# Patient Record
Sex: Male | Born: 1935 | Race: White | Hispanic: No | Smoking: Former smoker
Health system: Southern US, Community
[De-identification: ages and names within clinical notes are randomized; demographics above are authoritative.]

## PROBLEM LIST (undated history)

## (undated) DIAGNOSIS — C801 Malignant (primary) neoplasm, unspecified: Secondary | ICD-10-CM

## (undated) DIAGNOSIS — R51 Headache: Secondary | ICD-10-CM

## (undated) DIAGNOSIS — E119 Type 2 diabetes mellitus without complications: Secondary | ICD-10-CM

## (undated) HISTORY — PX: HERNIA REPAIR: SHX51

## (undated) HISTORY — DX: Malignant (primary) neoplasm, unspecified: C80.1

## (undated) HISTORY — DX: Type 2 diabetes mellitus without complications: E11.9

## (undated) HISTORY — PX: EYE SURGERY: SHX253

## (undated) HISTORY — DX: Headache: R51

---

## 2004-11-12 ENCOUNTER — Other Ambulatory Visit: Payer: Self-pay

## 2004-11-12 ENCOUNTER — Emergency Department: Payer: Self-pay | Admitting: Emergency Medicine

## 2004-11-25 ENCOUNTER — Ambulatory Visit: Payer: Self-pay | Admitting: Internal Medicine

## 2004-12-09 ENCOUNTER — Ambulatory Visit: Payer: Self-pay | Admitting: Oncology

## 2004-12-26 ENCOUNTER — Ambulatory Visit: Payer: Self-pay | Admitting: Oncology

## 2005-04-07 ENCOUNTER — Ambulatory Visit: Payer: Self-pay | Admitting: Oncology

## 2005-04-11 ENCOUNTER — Ambulatory Visit: Payer: Self-pay | Admitting: Family Medicine

## 2005-04-25 ENCOUNTER — Ambulatory Visit: Payer: Self-pay | Admitting: Oncology

## 2005-08-08 ENCOUNTER — Ambulatory Visit: Payer: Self-pay | Admitting: Oncology

## 2005-08-26 ENCOUNTER — Ambulatory Visit: Payer: Self-pay | Admitting: Oncology

## 2006-01-18 ENCOUNTER — Ambulatory Visit: Payer: Self-pay | Admitting: Gastroenterology

## 2006-01-30 ENCOUNTER — Ambulatory Visit: Payer: Self-pay | Admitting: Gastroenterology

## 2006-02-06 ENCOUNTER — Ambulatory Visit: Payer: Self-pay | Admitting: Oncology

## 2006-02-23 ENCOUNTER — Ambulatory Visit: Payer: Self-pay | Admitting: Oncology

## 2006-08-07 ENCOUNTER — Ambulatory Visit: Payer: Self-pay | Admitting: Oncology

## 2006-08-26 ENCOUNTER — Ambulatory Visit: Payer: Self-pay | Admitting: Oncology

## 2006-09-25 ENCOUNTER — Ambulatory Visit: Payer: Self-pay | Admitting: Oncology

## 2006-10-27 ENCOUNTER — Emergency Department: Payer: Self-pay | Admitting: Internal Medicine

## 2006-10-27 ENCOUNTER — Other Ambulatory Visit: Payer: Self-pay

## 2006-11-08 ENCOUNTER — Inpatient Hospital Stay: Payer: Self-pay | Admitting: Internal Medicine

## 2006-11-08 ENCOUNTER — Other Ambulatory Visit: Payer: Self-pay

## 2006-11-30 ENCOUNTER — Ambulatory Visit: Payer: Self-pay | Admitting: Family Medicine

## 2007-01-22 ENCOUNTER — Ambulatory Visit: Payer: Self-pay | Admitting: Gastroenterology

## 2007-02-01 ENCOUNTER — Ambulatory Visit: Payer: Self-pay | Admitting: Internal Medicine

## 2007-02-24 ENCOUNTER — Ambulatory Visit: Payer: Self-pay | Admitting: Internal Medicine

## 2007-03-27 ENCOUNTER — Ambulatory Visit: Payer: Self-pay | Admitting: Family Medicine

## 2007-07-27 ENCOUNTER — Ambulatory Visit: Payer: Self-pay | Admitting: Internal Medicine

## 2007-07-31 ENCOUNTER — Ambulatory Visit: Payer: Self-pay | Admitting: Oncology

## 2007-08-27 ENCOUNTER — Ambulatory Visit: Payer: Self-pay | Admitting: Oncology

## 2007-08-27 ENCOUNTER — Ambulatory Visit: Payer: Self-pay | Admitting: Internal Medicine

## 2007-09-26 ENCOUNTER — Ambulatory Visit: Payer: Self-pay | Admitting: Oncology

## 2007-11-05 ENCOUNTER — Ambulatory Visit: Payer: Self-pay | Admitting: Ophthalmology

## 2008-01-24 ENCOUNTER — Ambulatory Visit: Payer: Self-pay | Admitting: Gastroenterology

## 2008-01-27 ENCOUNTER — Ambulatory Visit: Payer: Self-pay | Admitting: Internal Medicine

## 2008-01-29 ENCOUNTER — Ambulatory Visit: Payer: Self-pay | Admitting: Internal Medicine

## 2008-02-24 ENCOUNTER — Ambulatory Visit: Payer: Self-pay | Admitting: Internal Medicine

## 2008-06-16 ENCOUNTER — Other Ambulatory Visit: Payer: Self-pay

## 2008-06-16 ENCOUNTER — Emergency Department: Payer: Self-pay | Admitting: Emergency Medicine

## 2008-06-24 ENCOUNTER — Ambulatory Visit: Payer: Self-pay | Admitting: Family Medicine

## 2008-07-26 ENCOUNTER — Ambulatory Visit: Payer: Self-pay | Admitting: Internal Medicine

## 2008-07-29 ENCOUNTER — Ambulatory Visit: Payer: Self-pay | Admitting: Oncology

## 2008-07-31 ENCOUNTER — Ambulatory Visit: Payer: Self-pay | Admitting: Gastroenterology

## 2008-08-26 ENCOUNTER — Ambulatory Visit: Payer: Self-pay | Admitting: Oncology

## 2008-08-26 ENCOUNTER — Ambulatory Visit: Payer: Self-pay | Admitting: Internal Medicine

## 2008-12-31 ENCOUNTER — Ambulatory Visit: Payer: Self-pay | Admitting: Physician Assistant

## 2009-01-26 ENCOUNTER — Ambulatory Visit: Payer: Self-pay | Admitting: Internal Medicine

## 2009-01-27 ENCOUNTER — Ambulatory Visit: Payer: Self-pay | Admitting: Oncology

## 2009-02-03 ENCOUNTER — Ambulatory Visit: Payer: Self-pay | Admitting: Gastroenterology

## 2009-02-23 ENCOUNTER — Ambulatory Visit: Payer: Self-pay | Admitting: Oncology

## 2009-02-23 ENCOUNTER — Ambulatory Visit: Payer: Self-pay | Admitting: Internal Medicine

## 2009-07-26 ENCOUNTER — Ambulatory Visit: Payer: Self-pay | Admitting: Oncology

## 2009-08-04 ENCOUNTER — Ambulatory Visit: Payer: Self-pay | Admitting: Oncology

## 2009-08-26 ENCOUNTER — Ambulatory Visit: Payer: Self-pay | Admitting: Oncology

## 2010-01-26 ENCOUNTER — Ambulatory Visit: Payer: Self-pay | Admitting: Internal Medicine

## 2010-02-09 ENCOUNTER — Ambulatory Visit: Payer: Self-pay | Admitting: Internal Medicine

## 2010-02-23 ENCOUNTER — Ambulatory Visit: Payer: Self-pay | Admitting: Internal Medicine

## 2010-04-05 ENCOUNTER — Ambulatory Visit: Payer: Self-pay | Admitting: Gastroenterology

## 2010-08-10 ENCOUNTER — Ambulatory Visit: Payer: Self-pay | Admitting: Internal Medicine

## 2010-08-10 ENCOUNTER — Ambulatory Visit: Payer: Self-pay | Admitting: Oncology

## 2010-08-12 LAB — CEA: CEA: 1.4 ng/mL (ref 0.0–4.7)

## 2010-08-26 ENCOUNTER — Ambulatory Visit: Payer: Self-pay | Admitting: Oncology

## 2010-08-26 ENCOUNTER — Ambulatory Visit: Payer: Self-pay | Admitting: Internal Medicine

## 2011-09-06 ENCOUNTER — Ambulatory Visit: Payer: Self-pay | Admitting: Internal Medicine

## 2011-09-26 ENCOUNTER — Ambulatory Visit: Payer: Self-pay | Admitting: Internal Medicine

## 2012-09-10 ENCOUNTER — Ambulatory Visit: Payer: Self-pay | Admitting: Oncology

## 2012-09-10 LAB — CBC CANCER CENTER
Basophil #: 0.1 x10 3/mm (ref 0.0–0.1)
Eosinophil %: 3.3 %
HCT: 42.8 % (ref 40.0–52.0)
HGB: 14.4 g/dL (ref 13.0–18.0)
Lymphocyte #: 1.7 x10 3/mm (ref 1.0–3.6)
Lymphocyte %: 30 %
MCHC: 33.7 g/dL (ref 32.0–36.0)
MCV: 93 fL (ref 80–100)
Monocyte %: 8.4 %
Neutrophil %: 57.3 %
Platelet: 119 x10 3/mm — ABNORMAL LOW (ref 150–440)
RBC: 4.6 10*6/uL (ref 4.40–5.90)
RDW: 14.3 % (ref 11.5–14.5)

## 2012-09-10 LAB — COMPREHENSIVE METABOLIC PANEL
Albumin: 4 g/dL (ref 3.4–5.0)
Alkaline Phosphatase: 70 U/L (ref 50–136)
Anion Gap: 3 — ABNORMAL LOW (ref 7–16)
Bilirubin,Total: 1.1 mg/dL — ABNORMAL HIGH (ref 0.2–1.0)
Calcium, Total: 9.5 mg/dL (ref 8.5–10.1)
Co2: 33 mmol/L — ABNORMAL HIGH (ref 21–32)
Glucose: 118 mg/dL — ABNORMAL HIGH (ref 65–99)
SGOT(AST): 33 U/L (ref 15–37)
Sodium: 138 mmol/L (ref 136–145)
Total Protein: 7.3 g/dL (ref 6.4–8.2)

## 2012-09-11 LAB — CEA: CEA: 1.6 ng/mL (ref 0.0–4.7)

## 2012-09-25 ENCOUNTER — Ambulatory Visit: Payer: Self-pay | Admitting: Oncology

## 2012-12-14 DIAGNOSIS — I10 Essential (primary) hypertension: Secondary | ICD-10-CM | POA: Insufficient documentation

## 2012-12-14 DIAGNOSIS — IMO0002 Reserved for concepts with insufficient information to code with codable children: Secondary | ICD-10-CM | POA: Insufficient documentation

## 2012-12-14 DIAGNOSIS — M3390 Dermatopolymyositis, unspecified, organ involvement unspecified: Secondary | ICD-10-CM | POA: Insufficient documentation

## 2012-12-14 DIAGNOSIS — R4189 Other symptoms and signs involving cognitive functions and awareness: Secondary | ICD-10-CM | POA: Insufficient documentation

## 2013-08-22 ENCOUNTER — Ambulatory Visit: Payer: Self-pay | Admitting: Surgery

## 2013-08-22 LAB — BASIC METABOLIC PANEL
Anion Gap: 5 — ABNORMAL LOW (ref 7–16)
Calcium, Total: 9.4 mg/dL (ref 8.5–10.1)
Chloride: 101 mmol/L (ref 98–107)
Co2: 30 mmol/L (ref 21–32)
Creatinine: 0.96 mg/dL (ref 0.60–1.30)
EGFR (Non-African Amer.): >60
Glucose: 118 mg/dL — ABNORMAL HIGH (ref 65–99)
Osmolality: 271 (ref 275–301)
Potassium: 4.7 mmol/L (ref 3.5–5.1)

## 2013-08-22 LAB — CBC WITH DIFFERENTIAL/PLATELET
Basophil #: 0 10*3/uL (ref 0.0–0.1)
Basophil %: 0.7 %
Eosinophil #: 0.2 10*3/uL (ref 0.0–0.7)
Eosinophil %: 3.1 %
HGB: 14.1 g/dL (ref 13.0–18.0)
Lymphocyte #: 1.7 10*3/uL (ref 1.0–3.6)
MCHC: 34.7 g/dL (ref 32.0–36.0)
Monocyte #: 0.4 x10 3/mm (ref 0.2–1.0)
Platelet: 128 10*3/uL — ABNORMAL LOW (ref 150–440)
RDW: 14.4 % (ref 11.5–14.5)

## 2013-08-30 ENCOUNTER — Ambulatory Visit: Payer: Self-pay | Admitting: Surgery

## 2013-09-06 ENCOUNTER — Ambulatory Visit: Payer: Self-pay | Admitting: Oncology

## 2013-09-09 LAB — CBC CANCER CENTER
Basophil %: 1.1 %
Eosinophil %: 2.8 %
HCT: 41.6 % (ref 40.0–52.0)
HGB: 14.2 g/dL (ref 13.0–18.0)
Lymphocyte %: 33.9 %
MCH: 31.2 pg (ref 26.0–34.0)
MCV: 92 fL (ref 80–100)
Monocyte #: 0.5 x10 3/mm (ref 0.2–1.0)
Neutrophil #: 3 x10 3/mm (ref 1.4–6.5)
Neutrophil %: 52.6 %
RBC: 4.54 10*6/uL (ref 4.40–5.90)

## 2013-09-09 LAB — COMPREHENSIVE METABOLIC PANEL
Albumin: 3.7 g/dL (ref 3.4–5.0)
BUN: 6 mg/dL — ABNORMAL LOW (ref 7–18)
Co2: 32 mmol/L (ref 21–32)
EGFR (African American): >60
EGFR (Non-African Amer.): >60
Osmolality: 281 (ref 275–301)
Potassium: 5.4 mmol/L — ABNORMAL HIGH (ref 3.5–5.1)
SGOT(AST): 19 U/L (ref 15–37)
SGPT (ALT): 29 U/L (ref 12–78)
Sodium: 142 mmol/L (ref 136–145)
Total Protein: 6.9 g/dL (ref 6.4–8.2)

## 2013-09-25 ENCOUNTER — Ambulatory Visit: Payer: Self-pay | Admitting: Oncology

## 2013-10-22 ENCOUNTER — Ambulatory Visit: Payer: Self-pay | Admitting: Surgery

## 2013-10-24 LAB — PATHOLOGY REPORT

## 2014-03-12 ENCOUNTER — Ambulatory Visit: Payer: Self-pay | Admitting: Oncology

## 2014-03-12 LAB — COMPREHENSIVE METABOLIC PANEL
ANION GAP: 7 (ref 7–16)
Albumin: 3.8 g/dL (ref 3.4–5.0)
Alkaline Phosphatase: 62 U/L
BILIRUBIN TOTAL: 1.2 mg/dL — AB (ref 0.2–1.0)
BUN: 11 mg/dL (ref 7–18)
CALCIUM: 9.4 mg/dL (ref 8.5–10.1)
CHLORIDE: 103 mmol/L (ref 98–107)
CREATININE: 1.15 mg/dL (ref 0.60–1.30)
Co2: 29 mmol/L (ref 21–32)
EGFR (African American): >60
EGFR (Non-African Amer.): >60
Glucose: 109 mg/dL — ABNORMAL HIGH (ref 65–99)
OSMOLALITY: 278 (ref 275–301)
Potassium: 5 mmol/L (ref 3.5–5.1)
SGOT(AST): 23 U/L (ref 15–37)
SGPT (ALT): 26 U/L (ref 12–78)
Sodium: 139 mmol/L (ref 136–145)
TOTAL PROTEIN: 7.3 g/dL (ref 6.4–8.2)

## 2014-03-12 LAB — CBC CANCER CENTER
BASOS ABS: 0 x10 3/mm (ref 0.0–0.1)
Basophil %: 0.9 %
Eosinophil #: 0.2 x10 3/mm (ref 0.0–0.7)
Eosinophil %: 5.3 %
HCT: 43.6 % (ref 40.0–52.0)
HGB: 14.5 g/dL (ref 13.0–18.0)
LYMPHS PCT: 34.6 %
Lymphocyte #: 1.6 x10 3/mm (ref 1.0–3.6)
MCH: 30.3 pg (ref 26.0–34.0)
MCHC: 33.2 g/dL (ref 32.0–36.0)
MCV: 91 fL (ref 80–100)
MONO ABS: 0.4 x10 3/mm (ref 0.2–1.0)
MONOS PCT: 8.4 %
NEUTROS ABS: 2.3 x10 3/mm (ref 1.4–6.5)
Neutrophil %: 50.8 %
Platelet: 115 x10 3/mm — ABNORMAL LOW (ref 150–440)
RBC: 4.78 10*6/uL (ref 4.40–5.90)
RDW: 13.9 % (ref 11.5–14.5)
WBC: 4.5 x10 3/mm (ref 3.8–10.6)

## 2014-03-26 ENCOUNTER — Ambulatory Visit: Payer: Self-pay | Admitting: Oncology

## 2014-06-28 DIAGNOSIS — J449 Chronic obstructive pulmonary disease, unspecified: Secondary | ICD-10-CM | POA: Insufficient documentation

## 2014-07-08 DIAGNOSIS — D510 Vitamin B12 deficiency anemia due to intrinsic factor deficiency: Secondary | ICD-10-CM | POA: Insufficient documentation

## 2014-07-08 DIAGNOSIS — E119 Type 2 diabetes mellitus without complications: Secondary | ICD-10-CM | POA: Insufficient documentation

## 2014-07-08 DIAGNOSIS — E1169 Type 2 diabetes mellitus with other specified complication: Secondary | ICD-10-CM | POA: Insufficient documentation

## 2014-07-08 DIAGNOSIS — E785 Hyperlipidemia, unspecified: Secondary | ICD-10-CM | POA: Insufficient documentation

## 2014-10-17 ENCOUNTER — Observation Stay: Payer: Self-pay | Admitting: Internal Medicine

## 2014-10-17 LAB — COMPREHENSIVE METABOLIC PANEL
ALBUMIN: 3.6 g/dL (ref 3.4–5.0)
ALK PHOS: 55 U/L
ALT: 31 U/L
AST: 22 U/L (ref 15–37)
Anion Gap: 10 (ref 7–16)
BILIRUBIN TOTAL: 1.2 mg/dL — AB (ref 0.2–1.0)
BUN: 11 mg/dL (ref 7–18)
CALCIUM: 8.9 mg/dL (ref 8.5–10.1)
CHLORIDE: 105 mmol/L (ref 98–107)
Co2: 24 mmol/L (ref 21–32)
Creatinine: 0.96 mg/dL (ref 0.60–1.30)
Glucose: 174 mg/dL — ABNORMAL HIGH (ref 65–99)
Osmolality: 281 (ref 275–301)
POTASSIUM: 4.4 mmol/L (ref 3.5–5.1)
Sodium: 139 mmol/L (ref 136–145)
TOTAL PROTEIN: 6.9 g/dL (ref 6.4–8.2)

## 2014-10-17 LAB — CBC
HCT: 43.1 % (ref 40.0–52.0)
HGB: 14.1 g/dL (ref 13.0–18.0)
MCH: 31 pg (ref 26.0–34.0)
MCHC: 32.7 g/dL (ref 32.0–36.0)
MCV: 95 fL (ref 80–100)
Platelet: 127 10*3/uL — ABNORMAL LOW (ref 150–440)
RBC: 4.55 10*6/uL (ref 4.40–5.90)
RDW: 14.1 % (ref 11.5–14.5)
WBC: 5 10*3/uL (ref 3.8–10.6)

## 2014-10-17 LAB — TROPONIN I: Troponin-I: <0.02 ng/mL

## 2014-10-18 DIAGNOSIS — I358 Other nonrheumatic aortic valve disorders: Secondary | ICD-10-CM

## 2014-10-18 LAB — CBC WITH DIFFERENTIAL/PLATELET
BASOS ABS: 0 10*3/uL (ref 0.0–0.1)
Basophil %: 0.8 %
EOS ABS: 0.2 10*3/uL (ref 0.0–0.7)
Eosinophil %: 3.3 %
HCT: 39.6 % — ABNORMAL LOW (ref 40.0–52.0)
HGB: 13.5 g/dL (ref 13.0–18.0)
LYMPHS PCT: 37.6 %
Lymphocyte #: 2.2 10*3/uL (ref 1.0–3.6)
MCH: 32 pg (ref 26.0–34.0)
MCHC: 34 g/dL (ref 32.0–36.0)
MCV: 94 fL (ref 80–100)
MONO ABS: 0.4 x10 3/mm (ref 0.2–1.0)
MONOS PCT: 7.3 %
NEUTROS ABS: 3 10*3/uL (ref 1.4–6.5)
Neutrophil %: 51 %
Platelet: 120 10*3/uL — ABNORMAL LOW (ref 150–440)
RBC: 4.2 10*6/uL — AB (ref 4.40–5.90)
RDW: 14.4 % (ref 11.5–14.5)
WBC: 5.8 10*3/uL (ref 3.8–10.6)

## 2014-10-18 LAB — BASIC METABOLIC PANEL
Anion Gap: 9 (ref 7–16)
BUN: 14 mg/dL (ref 7–18)
CALCIUM: 8.2 mg/dL — AB (ref 8.5–10.1)
Chloride: 107 mmol/L (ref 98–107)
Co2: 26 mmol/L (ref 21–32)
Creatinine: 1.02 mg/dL (ref 0.60–1.30)
EGFR (African American): >60
Glucose: 99 mg/dL (ref 65–99)
Osmolality: 284 (ref 275–301)
Potassium: 3.8 mmol/L (ref 3.5–5.1)
Sodium: 142 mmol/L (ref 136–145)

## 2014-10-18 LAB — LIPID PANEL
Cholesterol: 144 mg/dL (ref 0–200)
HDL Cholesterol: 57 mg/dL (ref 40–60)
Ldl Cholesterol, Calc: 70 mg/dL (ref 0–100)
Triglycerides: 85 mg/dL (ref 0–200)
VLDL Cholesterol, Calc: 17 mg/dL (ref 5–40)

## 2015-03-18 ENCOUNTER — Ambulatory Visit
Admit: 2015-03-18 | Disposition: A | Discharge status: Home / Self Care | Payer: Self-pay | Attending: Oncology | Admitting: Oncology

## 2015-03-20 LAB — CBC CANCER CENTER
Basophil %: 0.9 %
Eosinophil #: 0.2 x10 3/mm (ref 0.0–0.7)
Eosinophil %: 5.4 %
HCT: 41.3 % (ref 40.0–52.0)
HGB: 14.2 g/dL (ref 13.0–18.0)
Lymphocyte #: 1.7 x10 3/mm (ref 1.0–3.6)
Lymphocyte %: 39.5 %
MCH: 31.3 pg (ref 26.0–34.0)
MCHC: 34.4 g/dL (ref 32.0–36.0)
MCV: 91 fL (ref 80–100)
Monocyte #: 0.3 x10 3/mm (ref 0.2–1.0)
Monocyte %: 7.9 %
Neutrophil #: 2 x10 3/mm (ref 1.4–6.5)
Neutrophil %: 46.3 %
Platelet: 112 x10 3/mm — ABNORMAL LOW (ref 150–440)
RBC: 4.53 10*6/uL (ref 4.40–5.90)
RDW: 14.1 % (ref 11.5–14.5)
WBC: 4.3 x10 3/mm (ref 3.8–10.6)

## 2015-03-20 LAB — COMPREHENSIVE METABOLIC PANEL
Albumin: 4.2 g/dL
Alkaline Phosphatase: 47 U/L
Anion Gap: 5 — ABNORMAL LOW (ref 7–16)
BUN: 11 mg/dL
Bilirubin,Total: 1.1 mg/dL
CALCIUM: 9.3 mg/dL
CO2: 30 mmol/L
Chloride: 100 mmol/L — ABNORMAL LOW
Creatinine: 1 mg/dL
GLUCOSE: 148 mg/dL — AB
Potassium: 4.6 mmol/L
SGOT(AST): 25 U/L
SGPT (ALT): 24 U/L
SODIUM: 135 mmol/L
TOTAL PROTEIN: 7.1 g/dL

## 2015-03-21 ENCOUNTER — Ambulatory Visit: Payer: Self-pay | Admitting: Internal Medicine

## 2015-03-23 LAB — CEA: CEA: 1.5 ng/mL (ref 0.0–4.7)

## 2015-03-27 ENCOUNTER — Ambulatory Visit
Admit: 2015-03-27 | Disposition: A | Discharge status: Home / Self Care | Payer: Self-pay | Attending: Oncology | Admitting: Oncology

## 2015-04-17 NOTE — Op Note (Signed)
PATIENT NAME:  Bryan Buck, Bryan Buck MR#:  [id number] DATE OF BIRTH:  1934-01-06  DATE OF PROCEDURE:  08/30/2013  PREOPERATIVE DIAGNOSIS: Right inguinal hernia.   POSTOPERATIVE DIAGNOSIS: Right inguinal hernia.   PROCEDURE: Right inguinal hernia repair.   SURGEON: Rochel Brome, M.D.   ANESTHESIA: General.   INDICATIONS: This 81 year old male has a history of bulging in the right groin with moderate discomfort. A right inguinal hernia was demonstrated on physical exam and repair was recommended for definitive treatment.   DESCRIPTION OF PROCEDURE: The patient was placed on the operating table in the supine position under general anesthesia. The right lower abdomen was clipped and prepared with ChloraPrep, draped in a sterile manner.   Right lower quadrant transversely oriented suprapubic incision was made, carried down through subcutaneous tissues. Several small bleeding points were cauterized. Scarpa's fascia was incised. The external ring was incised along the course of its fibers to open the external ring and expose the inguinal cord structures. The inguinal cord structures were mobilized with blunt dissection and a Penrose drain was passed around the cord structures. Cremaster fibers were spread to expose an indirect hernia sac which was dissected free from surrounding structures. The sac was some 3-1/2 inches in length and was dissected well into the internal ring. The sac was somewhat thick, containing fatty tissue and was reduced down beneath the fascia. Next, the repair was carried out reinforcing the floor of the inguinal canal with interrupted 0 Surgilon sutures, suturing conjoined tendon to the shelving edge of the inguinal ligament incorporating transversalis fascia into the repair. The last stitch led to satisfactory narrowing of the internal ring. Next, onlay Atrium mesh was cut to create an oval shape of some 3 x 4.5 cm with a notch cut out to straddle the internal ring. This was sutured to  the repair and also sutured medially to the deep fascia and also sutured bilaterally around the internal ring. Hemostasis was intact. The cord structures were replaced along the floor of the inguinal canal. Cut edges of the external oblique aponeurosis were closed with running 4-0 Vicryl. The deep fascia superior and lateral to the repair site was infiltrated with 0.5% Sensorcaine with epinephrine, also subcutaneous tissues were infiltrated using a total of 20 mL. Next, the Scarpa's fascia was closed with interrupted 4-0 Vicryl. The skin was closed with running 4-0 Monocryl subcuticular suture and Dermabond. The testicle remained in the scrotum. The patient tolerated surgery satisfactorily, and the patient was then prepared for transfer to the recovery room.     ____________________________ Lenna Sciara. Rochel Brome, MD jws:dmm D: 08/30/2013 10:49:30 ET T: 08/30/2013 11:07:45 ET JOB#: [id number]  cc: Loreli Dollar, MD, <Dictator> Loreli Dollar MD ELECTRONICALLY SIGNED 09/02/2013 20:08

## 2015-04-17 NOTE — Op Note (Signed)
PATIENT NAME:  Bryan Buck, Bryan Buck MR#:  [id number] DATE OF BIRTH:  10/26/1934  DATE OF PROCEDURE:  10/22/2013  COMMENT:  The procedure was done on October 28 and an immediate computer generated report was made; however, at the current time, this cannot be found on the medical record and therefore dictated.   PREOPERATIVE DIAGNOSIS: Weight loss, a history of Barrett esophagus, history of colon cancer.   POSTOPERATIVE DIAGNOSIS:  Weight loss, a history of Barrett esophagus, history of colon cancer, gastritis.   PROCEDURE: Colonoscopy, upper endoscopy and biopsy.   SURGEON: Rochel Brome, M.D.   ANESTHESIA: General.   INDICATIONS: Mr. Lorenson has a past history of colon cancer, a past history of Barrett esophagus, recent profound weight loss and further evaluation was recommended.   The patient was placed on the stretcher in the left lateral decubitus position and monitored, given IV fluids, was monitored with pulse oximetry, intermittent blood pressure recordings and cardiac monitor, given oxygen via nasal cannula.   The colonoscope was inserted and manipulated up through the left colon, through the transverse colon, to the ileocolic anastomosis. The anastomosis appeared typical as a side-to-side functional end-to-end anastomosis. Colon preparation appeared to be good. The scope was gradually pulled back examining the colonic mucosa. No polyps or tumors were seen. The rectum appeared normal. The scope was retroflexed and distal rectum appeared normal. Carbon dioxide was aspirated, and the colonoscope removed.   The patient appeared to be in satisfactory condition. The stretcher was turned 180 degrees. New scope, gowns and gloves were used.   The Olympus video endoscope was inserted down through the posterior pharynx into the esophagus and manipulated down into the stomach and into the first and second parts of the duodenum. The duodenum appeared normal. The pylorus appeared normal. The scope was  pulled back. There appeared to be some mild degree of gastritis with some red streaks in the antrum but no ulcers and no polyps. The scope was gradually pulled back, examining the body of the stomach which appeared normal. The scope was retroflexed, viewing the cardia which had a normal anatomy. There was no hiatus hernia seen. The scope was pulled back to note some Barrett esophagus. Subsequently, the scope was passed back down to the antrum and took 4 random biopsies of the greater curvature to the antrum for pathology. Bleeding was very scant. Hemostasis subsequently intact.   The scope was pulled back to further examine the esophagus. There was an appearance of gastric mucosa in the lining of the esophagus over a distance of approximately 8 cm. This is not continuous, but somewhat interrupted by squamous epithelium. Five biopsies were taken at approximately 1 to 1.5 cm intervals at the site of the colonic mucosa. Bleeding was very scant. Hemostasis subsequently intact.   Again, examined the antrum. There was no continued bleeding. The scope was gradually pulled back further examining the esophagus, seeing no other polyps or tumors, then the scope was subsequently removed.   The patient appeared to be in satisfactory condition and was prepared for transfer to the recovery room.     ____________________________ Lenna Sciara. Rochel Brome, MD jws:dmm D: 11/01/2013 16:33:40 ET T: 11/01/2013 19:04:22 ET JOB#: [id number]  cc: Loreli Dollar, MD, <Dictator> Loreli Dollar MD ELECTRONICALLY SIGNED 11/04/2013 18:05

## 2015-04-18 NOTE — H&P (Signed)
PATIENT NAME:  Bryan Buck, Bryan Buck MR#:  [id number] DATE OF BIRTH:  [birth year]  DATE OF ADMISSION:  10/17/2014  PRIMARY ONCOLOGIST: Dr. Oliva Bustard.   REFERRING EMERGENCY ROOM PHYSICIAN: Dr. Jimmye Norman.   CHIEF COMPLAINT: Severe dizziness.   HISTORY OF PRESENTING ILLNESS: A 81 year old male who has a history of colon cancer, diabetes, Barrett esophagus, COPD, who lives alone and independent in day-to-day activity. For the last 2 days, he said that he is feeling severe dizziness. Every time he tried to get out of the bed he feels his head start spinning and he could not hold his legs still and feels like he will fall down, so he is very afraid to get out of his bed and he is just staying in the bed or crawling to go to some places and not able to even eat because of that. He denies any nausea or vomiting. He denies any head injury. He denies any vision or hearing changes. He said that he has some pain in the left side of the head, but denies any injuries or ear pain or hearing loss or ringing in the ears. Finally today he decided to call EMS and was brought to the Emergency Room. In the ER, a CT scan of the head was done which was negative. The ER physician thought maybe it was benign positional vertigo and tried to give him meclizine and explained to him to go home, but because he is feeling severely dizzy, he denies to go home like that. The ER physician came to me for admission and rule out posterior circulation stroke.   REVIEW OF SYSTEMS.  CONSTITUTIONAL: Negative for fever, fatigue, weakness, pain or weight loss.  EYES: No blurring, double vision, discharge or redness.  ENT: No tinnitus, ear pain or hearing loss.  RESPIRATORY: No cough, wheezing, or shortness of breath.  CARDIOVASCULAR: No chest pain, orthopnea, edema, arrhythmia, palpitations GASTROINTESTINAL: No nausea, vomiting, diarrhea, abdominal pain.  GENITOURINARY: No dysuria, hematuria, increased frequency.  ENDOCRINE: No heat or cold  intolerance. No excessive sweating.   SKIN: No acne, rashes, or lesions.  MUSCULOSKELETAL: No pain or swelling in the joints.  NEUROLOGICAL: The patient has severe dizziness and vertigo. No weakness or numbness, tremor or rigidity.  PSYCHIATRIC: No anxiety, insomnia, bipolar disorder.   PAST MEDICAL HISTORY: 1.  Colon cancer, status post resection and chemotherapy.  2.  Diabetes mellitus.  3.  Barrett esophagus.  4.  COPD.  5.  Hypercholesterolemia.   PAST SURGICAL HISTORY: Appendectomy in 1952 and colon resection.   FAMILY HISTORY: Mother died of old age. Father died of melanoma. Father also had history of heart disease and diabetes.   SOCIAL HISTORY: Does not smoke. No alcohol or illegal drug use. Lives alone and independent in day-to-day activities.   PHYSICAL EXAMINATION: VITAL SIGNS: In ER, temperature 97.8, pulse is 84, respirations 18, blood pressure is 158/81. Pulse oximetry is 98% on room air.  GENERAL: The patient is fully alert and oriented to time, place, and person. Does not appear in any acute distress.  HEENT: Head atraumatic. Conjunctivae pink. Oral mucosa moist.  NECK: Supple. No JVD.  RESPIRATORY: Bilateral equal and clear air entry.  CARDIOVASCULAR: S1, S2 present, regular. No murmur.  ABDOMEN: Soft, nontender. Bowel sounds present. No organomegaly.  SKIN: No rashes.  LEGS: No edema.  NEUROLOGICAL: Power 5/5. Moves all 4 limbs. No tremor or rigidity. Cranial nerves intact. Finger-nose test is able to perform, very fast and very accurately. Heel-to-shin test bilaterally is very accurate and  doing fast, but not willing to come off the bed because he says he is feeling severely dizzy and might fall down.  JOINTS: No swelling or tenderness.  PSYCHIATRIC: Does not appear in any acute psychiatric illness at this time.   IMPORTANT LABORATORY RESULTS: CT scan of the head, no acute intracranial abnormality, mild to moderate atrophy. Glucose 174, BUN 11, creatinine 0.96,  sodium 139, potassium is 4.4, chloride 105, CO2 is 24, calcium is 8.9. Total protein 6.9. Albumin is 3.6, bilirubin 1.2, alkaline phosphatase 55, SGOT 22, and SGPT 31. Troponin less than 0.02. WBC 5, hemoglobin 14.1, platelet count is 127, MCV is 95.   ASSESSMENT AND PLAN: A 81 year old male with history of diabetes and colon cancer came to Emergency Room with a complaint of severe dizziness, not able to get off the bed.  1.  Dizziness. Most likely this might be benign positional vertigo, but will have to rule out a stroke. CAT scan is negative, but it can still be a posterior circulation stroke. Will do MRI of the brain to rule out that. We will do carotid Doppler and echocardiogram and monitor him on telemetry meanwhile. Will get neurology consult for further guiding [place]. Will also give him meclizine orally and Zofran and Reglan to help him with his benign positional vertigo or severe dizziness.  2.   Diabetes. Currently he is taking metformin at home. We will continue the same and we will put him on insulin sliding scale coverage.  3.  Chronic obstructive pulmonary disease. There is no active wheezing and he just uses inhaler treatment on as needed basis at home. Will continue monitoring for this.  4.  History of colon cancer. This is a chronic and stable issue and he is following with [facility] for that, so we will just let Dr. Oliva Bustard follow that.   CODE STATUS: Full code.   TOTAL TIME SPENT ON THIS ADMISSION: 50 minutes.    ____________________________ Ceasar Lund Anselm Jungling, MD vgv:at D: 10/17/2014 16:30:12 ET T: 10/17/2014 16:53:18 ET JOB#: [id number]  cc: Ceasar Lund. Anselm Jungling, MD, <Dictator> Martie Lee. Oliva Bustard, MD Vaughan Basta MD ELECTRONICALLY SIGNED 10/26/2014 18:22

## 2015-04-18 NOTE — Discharge Summary (Signed)
PATIENT NAME:  Bryan Buck, Bryan Buck MR#:  [id number] DATE OF BIRTH:  [birth year]  DISCHARGE DIAGNOSES: 1.  Benign positional vertigo.  2.  Chronic obstructive pulmonary disease.   DISCHARGE MEDICATIONS: 1.  Aspirin 81 mg daily.  2.  Metformin 1000 mg 2 times a day.  3.  Pravastatin 20 mg daily.  4.  Prilosec over-the-counter 20 mg oral once a day.  5.  Meclizine 25 mg oral 3 times a day as needed for vertigo.   DISCHARGE INSTRUCTIONS: Low-sodium diet. Activity as tolerated. To follow with primary care physician in 1-2 weeks.   IMAGING STUDIES DONE: Includes a CT scan of the head without contrast, which showed no acute abnormalities, age-related atrophy.   MRI of the brain showed some sinusitis, but no stroke or clots.   ADMITTING HISTORY AND PHYSICAL: Please see detailed H and P dictated previously, but in brief, a 81 year old male patient presented with severe dizziness, with benign positional vertigo, but in spite of aggressive treatment in the Emergency Room, was unable to walk. Lives alone and was admitted overnight for observation. The patient had an MRI done which showed no acute strokes. Telemetry showed no arrhythmias. The patient improved on meclizine and is being discharged home after ambulating in the hallway on his own without any problems.   Prior to discharge, the patient had no nystagmus.   NEUROLOGICAL EXAMINATION: Shows motor strength 5/5 in upper and lower extremities. Sensation was intact all over and gait normal.   TIME SPENT ON DAY OF DISCHARGE IN DISCHARGE ACTIVITY: 40 minutes.   ____________________________ Leia Alf Bora Broner, MD srs:LT D: 10/20/2014 13:44:37 ET T: 10/20/2014 21:48:53 ET JOB#: [id number]  cc: Alveta Heimlich R. Ell Tiso, MD, <Dictator> Neita Carp MD ELECTRONICALLY SIGNED 10/30/2014 14:55

## 2015-06-01 ENCOUNTER — Ambulatory Visit: Payer: Medicare Other | Admitting: Physical Therapy

## 2015-06-15 ENCOUNTER — Ambulatory Visit: Payer: Medicare Other | Attending: Otolaryngology | Admitting: Physical Therapy

## 2015-06-15 DIAGNOSIS — R42 Dizziness and giddiness: Secondary | ICD-10-CM | POA: Insufficient documentation

## 2015-06-15 DIAGNOSIS — R519 Headache, unspecified: Secondary | ICD-10-CM

## 2015-06-15 HISTORY — DX: Headache, unspecified: R51.9

## 2015-06-16 ENCOUNTER — Encounter: Payer: Self-pay | Admitting: Physical Therapy

## 2015-06-16 NOTE — Therapy (Addendum)
[place] MAIN [facility] SERVICES [address] [place], [place], [address] Phone: [phone]   Fax:  [phone]  Physical Therapy Evaluation  Patient Details  Name: Bryan Buck MRN: [id number] Date of Birth: [birth year] Referring Provider:  Clyde Canterbury, MD  Encounter Date: 06/15/2015      PT End of Session - 06/15/15 0943    Visit Number 1   Number of Visits 12   Date for PT Re-Evaluation 07/27/15   PT Start Time 0942   PT Stop Time 1058   PT Time Calculation (min) 76 min   Activity Tolerance Patient tolerated treatment well   Behavior During Therapy [facility] for tasks assessed/performed      Past Medical History  Diagnosis Date  . Cancer     colon  . Diabetes mellitus without complication   . Generalized headaches 06/15/2015    pt reported    Past Surgical History  Procedure Laterality Date  . Hernia repair    . Eye surgery      lens implant right eye    There were no vitals filed for this visit.  Visit Diagnosis:  Dizziness and giddiness - Plan: PT plan of care cert/re-cert      Subjective Assessment - 06/15/15 0950    Subjective Pt states that he is still having dizizness   Pertinent History Pt reports that his symptoms first began at the end of Oct 2015. Pt states that he had intense dizziness and vertigo that lasted several days. Pt states he was crawling around his home for three days before deciding to call for assistance. Upon discharge from the hospital, patient was referred to Dr. Reola Mosher office for follow-up care. Pt reports that he has been seen several times by Dr. Reola Mosher office. 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 Per MR, pt has been treated with canalith repositioning maneuvers several times at the ENT office. Pt reports that he had an episode Friday a week ago in the evening. Pt states he went to the ENT last week for treatment. Pt describes  his dizziness as lightheadedness, vertigo, unsteadiness. Pt reports his dizziness and vertigo increase with bending over, rolling over on his right side in bed, stress, head turns, and body turns. Pt reports that lying still and possibly the Meclizine help to decrease his symptoms. Pt states he feels "yucky all the time and even right now it feels like it is going to start" in regards to his dizziness symptoms. Pt states right now his dizziness is mild. Pt states he is having to walk with his feet wide open for balance and states he feels he might fall if he does not modify his walking. Pt reports that he feels his hearing has decreased in the right ear in the past year. Pt ambulates to the clinic without AD with reciprocating arm swing, step through gait pattern with fair scanning of his environment. Pt is prior independent ambulator, drives, independent with ADLs. Pt reports that he had a right eyes lens implant about three years ago and states he needs surgery in his left eye as well. However, pt states that he has not returned to his eye doctor in 3 years. Recommended that patient follow up with his eye doctor.    Currently in Pain? No/denies            [facility] PT Assessment - 06/16/15 0001    Sensation  Light Touch --  Intact Light touch B UEs and LEs            Vestibular Assessment - 06/16/15 0001    Symptom Behavior   Type of Dizziness Imbalance  vertigo, unsteadiness, lightheadedness   Occulomotor Exam   Occulomotor Alignment Normal   Spontaneous Absent   Gaze-induced Absent   Smooth Pursuits Intact   Saccades Intact   Vestibulo-Occular Reflex   VOR 1 Head Only (x 1 viewing) --  VOR positive-pt reports blurring of target & dizziness   VOR Cancellation --  Positive-VOR cancellation pt reports mild blurring of target      Ocular ROM WNL Right positive head thrust and pt reports blurring of target Left head thrust: negative  Canal testing: Performed horizontal head turn  test L: noted ageotrophic nystagmus; patient reported mild vertigo and blurring of vision. Performed horizontal head turn test R: noted ageotrophic nystagmus; patient rated vertigo as 4/10 and blurring of vision. Nystagmus lasted grossly less than one minute. Performed Bar-B-Que roll twice. Deferred Dix-Hallpike tests this date secondary to positive horizontal head turn testing.   Pt reports N & T in UEs and LEs. Pt states "my muscles ache all the time". Patient reports the bottom of his feet hurt, denies numbness but reports tingling.   On firm surface with EO with feet together, held 30 seconds On firm surface with EC with feet together, held 30 seconds with +1 sway On foam surface with EO with feet together, held 30 seconds with +1 sway On foam surface with EC with feet together, held 1.28 seconds before LOB                    PT Education - 06/15/15 0945    Education provided Yes   Education Details Discussed BPPV and bar-b-que roll repositioning, discussed vestibular therapy and POC   Person(s) Educated Patient   Methods Explanation;Demonstration   Comprehension Verbalized understanding          PT Short Term Goals - 06/16/15 1058    PT SHORT TERM GOAL #1   Title Pt will be independent with home exercise program for decreased fall risk and to improve safety with functional activities in 4 weeks.    Status New   PT SHORT TERM GOAL #2   Title Pt will report no nystagmus with vestibular canal testing positions in order to help patient decrease his risk of falls in 4 weeks.    Status New           PT Long Term Goals - 06/15/15 1034    PT LONG TERM GOAL #1   Title Pt will have decreased subjective symptoms of dizziness as evidenced by a decrease to low perception of handicap on the [facility] in 6 weeks.    Baseline 06/15/15: Pt scored 66-moderate perception of handicap   Time 6   Period Weeks   Status New   PT LONG TERM GOAL #2   Title Pt will improve balance as  evidenced by an increase score on the DGI to 23/24 or greater to reduce patient's falls risk in 6 weeks.    Baseline 06/15/15: Pt scored a 21/24   Time 6   Period Weeks   Status New   PT LONG TERM GOAL #3   Title Pt will be able to stand with EC and feet together on foam for > 10 seconds for decreased risk for falls in 6 weeks.    Baseline Pt able to  stand for <2 seconds    Time 6   Period Weeks   Status New               Plan - 06/21/2015 0953    Clinical Impression Statement This is a 81 y/o M that was referred due to dizziness. PT evaluation reveals vestibular dysfunction and indicators of potential horizontal canal BPPV. Pt would benefit from PT services to address deficits in vestibular function, decreased balance and mobility skills in order to help decrease patients' risk of falling, to improve patients function and to decrease patient's subjective symptoms of dizziness and imbalance.    Pt will benefit from skilled therapeutic intervention in order to improve on the following deficits Decreased balance;Difficulty walking;Dizziness;Other (comment)  vestibulo-ocular deficits   Rehab Potential Good   Clinical Impairments Affecting Rehab Potential Positive Indicators: motivated       Negative indicators: patient lives alone/support system; age   PT Frequency 2x / week   PT Duration 8 weeks   PT Treatment/Interventions Canalith Repostioning;Patient/family education;Neuromuscular re-education;Balance training;Therapeutic exercise;Therapeutic activities;Gait training;Stair training;Vestibular   PT Next Visit Plan Re-test           G-Codes - 06/21/2015 1530    Functional Assessment Tool Used DHI, DGI, ABC scale, 10 meter walking speed; clinical judgment   Functional Limitation Mobility: Walking and moving around   Mobility: Walking and Moving Around Current Status (P1031) At least 20 percent but less than 40 percent impaired, limited or restricted   Mobility: Walking and Moving  Around Goal Status [phone]) At least 1 percent but less than 20 percent impaired, limited or restricted     Problem List There are no active problems to display for this patient.  Lady Deutscher PT, DPT Lady Deutscher 06/16/2015, 3:32 PM  [place] MAIN [facility] SERVICES [address] [place], [place], [address] Phone: [phone]   Fax:  [phone]

## 2015-06-17 ENCOUNTER — Encounter: Payer: Self-pay | Admitting: Physical Therapy

## 2015-06-17 ENCOUNTER — Ambulatory Visit: Payer: Medicare Other | Admitting: Physical Therapy

## 2015-06-17 DIAGNOSIS — R42 Dizziness and giddiness: Secondary | ICD-10-CM | POA: Diagnosis not present

## 2015-06-17 NOTE — Therapy (Signed)
[place] MAIN [facility] SERVICES [address] [place], [place], [address] Phone: [phone]   Fax:  [phone]  Physical Therapy Treatment  Patient Details  Name: Bryan Buck MRN: [id number] Date of Birth: 07-10-1934 Referring Provider:  Clyde Canterbury, MD  Encounter Date: 06/17/2015      PT End of Session - 06/17/15 1201    Visit Number 2   Number of Visits 12   Date for PT Re-Evaluation 07/27/15   PT Start Time 1015   PT Stop Time 1055   PT Time Calculation (min) 40 min   Activity Tolerance Patient tolerated treatment well   Behavior During Therapy [facility] for tasks assessed/performed      Past Medical History  Diagnosis Date  . Cancer     colon  . Diabetes mellitus without complication   . Generalized headaches 06/15/2015    pt reported    Past Surgical History  Procedure Laterality Date  . Hernia repair    . Eye surgery      lens implant right eye    There were no vitals filed for this visit.  Visit Diagnosis:  Dizziness and giddiness      Subjective Assessment - 06/17/15 1158    Subjective Pt states that he has not had any episodes of vertigo since last PT visit. Pt states he still feels unsteady and weak. Pt states he is sleeping better.    Pertinent History Pt reports that his symptoms first began at the end of Oct 2015. Pt states that he had intense dizziness and vertigo that lasted several days. Pt states he was crawling around his home for three days before deciding to call for assistance. Upon discharge from the hospital, patient was referred to Dr. Reola Mosher office for follow-up care. Pt reports that he has been seen several times by Dr. Reola Mosher office. 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 Per MR, pt has been treated with canalith repositioning maneuvers several times at the ENT office. Pt reports that he had an episode Friday a week ago in the  evening. Pt states he went to the ENT last week for treatment. Pt describes his dizziness as lightheadedness, vertigo, unsteadiness. Pt reports his dizziness and vertigo increase with bending over, rolling over on his right side in bed, stress, head turns, and body turns. Pt reports that lying still and possibly the Meclizine help to decrease his symptoms. Pt states he feels "yucky all the time and even right now it feels like it is going to start" in regards to his dizziness symptoms. Pt states right now his dizziness is mild. Pt states he is having to walk with his feet wide open for balance and states he feels he might fall if he does not modify his walking. Pt reports that he feels his hearing has decreased in the right ear in the past year. Pt ambulates to the clinic without AD with reciprocating arm swing, step through gait pattern with fair scanning of his environment. Pt is prior independent ambulator, drives, independent with ADLs. Pt reports that he had a right eyes lens implant about three years ago and states he needs surgery in his left eye as well. However, pt states that he has not returned to his eye doctor in 3 years. Recommended that patient follow up with his eye doctor.    Currently in Pain? --  none stated  Treatment: Discussed BPPV with patient.  Pt with negative left Dix-Hallpike test. In right Dix-Hallpike test position, observed horizontal (ageotropic) nystagmus. Performed horizontal head turn test L: noted ageotropic nystagmus that lasted 65 seconds; patient reported 4/10 vertigo and blurring of his vision. Performed horizontal head turn test R: noted ageotropic nystagmus that lasted 65 seconds; pt reported 4/10 vertigo and blurring of his vision. Performed Bar-B-Que roll (log roll) maneuver times 3 reps. Note: pt performed last rep with supervision using his HEP handout and required one cue for second maneuver position. Held each test position 30-60 seconds. On the 3 rep, pt  reported no vertigo and no nystagmus was present in right head turned position, but did not ageotropic in left head turn position.  Discussed and issued Bar-B-Que roll/log roll maneuver for home exercise program and pt performed in the clinic.        PT Education - 06/17/15 1159    Education provided Yes   Education Details Discussed BPPV and POC; patient provided handout and educated as to doing log roll/ Bar-B-Que roll manuever at home including safety precautions and then pt demonstrated in the clinic.   Person(s) Educated Patient   Methods Demonstration;Explanation;Handout   Comprehension Verbalized understanding;Verbal cues required          PT Short Term Goals - 06/16/15 1058    PT SHORT TERM GOAL #1   Title Pt will be independent with home exercise program for decreased fall risk and to improve safety with functional activities in 4 weeks.    Status New   PT SHORT TERM GOAL #2   Title Pt will report no nystagmus with vestibular canal testing positions in order to help patient decrease his risk of falls in 4 weeks.    Status New           PT Long Term Goals - 06/15/15 1034    PT LONG TERM GOAL #1   Title Pt will have decreased subjective symptoms of dizziness as evidenced by a decrease to low perception of handicap on the [place] in 6 weeks.    Baseline 06/15/15: Pt scored 66-moderate perception of handicap   Time 6   Period Weeks   Status New   PT LONG TERM GOAL #2   Title Pt will improve balance as evidenced by an increase score on the DGI to 23/24 or greater to reduce patient's falls risk in 6 weeks.    Baseline 06/15/15: Pt scored a 21/24   Time 6   Period Weeks   Status New   PT LONG TERM GOAL #3   Title Pt will be able to stand with EC and feet together on foam for > 10 seconds for decreased risk for falls in 6 weeks.    Baseline Pt able to stand for <2 seconds    Time 6   Period Weeks   Status New           Plan - 06/17/15 1202    Clinical Impression  Statement Pt with positive L and R horizontal canal test this date and performed repositioning maneuver for right side. Pt with faded response with maneuvers. PT would benefit from continued PT services to address goals and to try to decrease patient's subjective symptoms.    Rehab Potential Good   Clinical Impairments Affecting Rehab Potential Positive Indicators: motivated       Negative indicators: patient lives alone/support system; age   PT Frequency 2x / week   PT Duration 8 weeks  PT Treatment/Interventions Canalith Repostioning;Patient/family education;Neuromuscular re-education;Balance training;Therapeutic exercise;Therapeutic activities;Gait training;Stair training;Vestibular   PT Next Visit Plan Re-test    PT Home Exercise Plan Pan to add additional exercises to HEP once negative canal testing is achieved   Consulted and Agree with Plan of Care Patient        Problem List There are no active problems to display for this patient.   Kailene Steinhart 06/17/2015, 12:14 PM  [place] MAIN [facility] SERVICES [address] [place], [place], [address] Phone: [phone]   Fax:  [phone]

## 2015-06-22 ENCOUNTER — Encounter: Payer: Medicare Other | Admitting: Physical Therapy

## 2015-07-01 ENCOUNTER — Encounter: Payer: Medicare Other | Admitting: Physical Therapy

## 2015-07-07 ENCOUNTER — Encounter: Payer: Medicare Other | Admitting: Physical Therapy

## 2015-09-11 ENCOUNTER — Other Ambulatory Visit: Payer: Self-pay | Admitting: *Deleted

## 2015-09-11 DIAGNOSIS — C189 Malignant neoplasm of colon, unspecified: Secondary | ICD-10-CM

## 2015-09-17 ENCOUNTER — Inpatient Hospital Stay (HOSPITAL_BASED_OUTPATIENT_CLINIC_OR_DEPARTMENT_OTHER): Payer: Medicare Other | Admitting: Oncology

## 2015-09-17 ENCOUNTER — Inpatient Hospital Stay: Payer: Medicare Other | Attending: Oncology

## 2015-09-17 ENCOUNTER — Encounter: Payer: Self-pay | Admitting: Oncology

## 2015-09-17 VITALS — BP 144/88 | HR 77 | Temp 96.0°F | Wt 197.0 lb

## 2015-09-17 DIAGNOSIS — R42 Dizziness and giddiness: Secondary | ICD-10-CM

## 2015-09-17 DIAGNOSIS — Z79899 Other long term (current) drug therapy: Secondary | ICD-10-CM

## 2015-09-17 DIAGNOSIS — Z85038 Personal history of other malignant neoplasm of large intestine: Secondary | ICD-10-CM | POA: Insufficient documentation

## 2015-09-17 DIAGNOSIS — E119 Type 2 diabetes mellitus without complications: Secondary | ICD-10-CM | POA: Insufficient documentation

## 2015-09-17 DIAGNOSIS — C189 Malignant neoplasm of colon, unspecified: Secondary | ICD-10-CM

## 2015-09-17 LAB — CBC WITH DIFFERENTIAL/PLATELET
BASOS ABS: 0 10*3/uL (ref 0–0.1)
Basophils Relative: 1 %
EOS PCT: 8 %
Eosinophils Absolute: 0.4 10*3/uL (ref 0–0.7)
HCT: 44.9 % (ref 40.0–52.0)
Hemoglobin: 15.3 g/dL (ref 13.0–18.0)
Lymphocytes Relative: 41 %
Lymphs Abs: 1.9 10*3/uL (ref 1.0–3.6)
MCH: 31.2 pg (ref 26.0–34.0)
MCHC: 34 g/dL (ref 32.0–36.0)
MCV: 91.7 fL (ref 80.0–100.0)
Monocytes Absolute: 0.4 10*3/uL (ref 0.2–1.0)
Monocytes Relative: 10 %
NEUTROS ABS: 1.8 10*3/uL (ref 1.4–6.5)
Neutrophils Relative %: 40 %
PLATELETS: 128 10*3/uL — AB (ref 150–440)
RBC: 4.9 MIL/uL (ref 4.40–5.90)
RDW: 14.1 % (ref 11.5–14.5)
WBC: 4.6 10*3/uL (ref 3.8–10.6)

## 2015-09-17 LAB — COMPREHENSIVE METABOLIC PANEL
ALT: 22 U/L (ref 17–63)
AST: 28 U/L (ref 15–41)
Albumin: 4.2 g/dL (ref 3.5–5.0)
Alkaline Phosphatase: 59 U/L (ref 38–126)
Anion gap: 6 (ref 5–15)
BUN: 10 mg/dL (ref 6–20)
CO2: 30 mmol/L (ref 22–32)
Calcium: 9.2 mg/dL (ref 8.9–10.3)
Chloride: 100 mmol/L — ABNORMAL LOW (ref 101–111)
Creatinine, Ser: 1.05 mg/dL (ref 0.61–1.24)
GFR calc Af Amer: >60 mL/min (ref >60)
Glucose, Bld: 212 mg/dL — ABNORMAL HIGH (ref 65–99)
POTASSIUM: 5.5 mmol/L — AB (ref 3.5–5.1)
Sodium: 136 mmol/L (ref 135–145)
TOTAL PROTEIN: 7.4 g/dL (ref 6.5–8.1)
Total Bilirubin: 1.5 mg/dL — ABNORMAL HIGH (ref 0.3–1.2)

## 2015-09-17 MED ORDER — DIAZEPAM 2 MG PO TABS
2.0000 mg | ORAL_TABLET | Freq: Two times a day (BID) | ORAL | Status: DC | PRN
Start: 2015-09-17 — End: 2019-11-25

## 2015-09-17 NOTE — Progress Notes (Signed)
Patient does have living will. Former smoker.  Complains of ongoing dizziness.

## 2015-09-18 LAB — CEA: CEA: 1.6 ng/mL (ref 0.0–4.7)

## 2015-09-20 ENCOUNTER — Encounter: Payer: Self-pay | Admitting: Oncology

## 2015-09-20 NOTE — Progress Notes (Signed)
[place] @ [facility] Telephone:([phone]) [phone]  Fax:([phone]     Bryan Buck OB: 13-Jan-1934  MR#: [id number]  TKP#:[id number]  Patient Care Team: Kirk Ruths, MD as PCP - General (Internal Medicine)  CHIEF COMPLAINT:  Chief Complaint  Patient presents with  . Dizziness   Chief Complaint/Problem List  1.  Carcinoma of the ascending colon, T3N0M0 tumor.  Status post resection.  Diagnosis in October 2003.  2.  Completed chemotherapy with Leucovorin and 5-FU in March 2004    INTERVAL HISTORY:   81 year old gentleman continues to complain dizziness.  Patient has been extensively evaluated by ENT surgeon as well as neurologist.  Meclizine did not help.  Patient also has history of carcinoma of colon no abdominal pain.  No nausea no vomiting no rectal bleeding.  REVIEW OF SYSTEMS:   Gen. status: Very apprehensive individual not in any acute distress. Neurological system: Continues to complain of dizziness when he gets up many moles.  An extensive evaluation by ENT physician as well as by neurologist No other significant problem All other systems have been reviewed (12 system) no abnormality detected O As per HPI. Otherwise, a complete review of systems is negatve.  PAST MEDICAL HISTORY: Past Medical History  Diagnosis Date  . Cancer     colon  . Diabetes mellitus without complication   . Generalized headaches 06/15/2015    pt reported    PAST SURGICAL HISTORY: Past Surgical History  Procedure Laterality Date  . Hernia repair    . Eye surgery      lens implant right eye      Diabetes Mellitus, Type II (NIDD):    colon ca:    copd:    Cataract Extraction:   PFSH: Social History: negative tobacco  Additional Past Medical and Surgical History: Past Medical History   Barrett's esophagus.  Colon cancer  Diabetes mellitus II    Past Surgical History   Appendectomy in 1952  Colon resection    Family History   Mother had colon cancer in the past.   Father died of melanoma.  History of diabetes and heart disease.    Social History   Does not smoke. Patient has increased his alcohol intake and drinks 1 to 3 beers nightly.   ADVANCED DIRECTIVES:  Patient does have advance healthcare directive, Patient   does not desire to make any changes HEALTH MAINTENANCE: Social History  Substance Use Topics  . Smoking status: Former Smoker -- 2.00 packs/day for 20 years    Types: Cigarettes  . Smokeless tobacco: Former Systems developer    Quit date: 06/14/1993  . Alcohol Use: 3.6 oz/week    6 Cans of beer per week      No Known Allergies  Current Outpatient Prescriptions  Medication Sig Dispense Refill  . aspirin EC 81 MG tablet Take 81 mg by mouth daily.    . metFORMIN (GLUMETZA) 1000 MG (MOD) 24 hr tablet Take 1,000 mg by mouth daily with breakfast.    . pravastatin (PRAVACHOL) 20 MG tablet Take 20 mg by mouth daily.    Marland Kitchen albuterol (PROAIR HFA) 108 (90 BASE) MCG/ACT inhaler Inhale into the lungs.    . diazepam (VALIUM) 2 MG tablet Take 1 tablet (2 mg total) by mouth every 12 (twelve) hours as needed for anxiety. DO NOT TAKE WITH MECLIZINE 60 tablet 1  . vitamin B-12 (CYANOCOBALAMIN) 500 MCG tablet Take by mouth.     No current facility-administered medications for this visit.    OBJECTIVE:  Filed Vitals:   09/17/15 1140  BP: 144/88  Pulse: 77  Temp: 96 F (35.6 C)     There is no height on file to calculate BMI.    ECOG FS:1 - Symptomatic but completely ambulatory  PHYSICAL EXAM: General  status: Performance status is good.  Patient has not lost significant weight HEENT: No evidence of stomatitis. Sclera and conjunctivae :: No jaundice.   pale looking. Lungs: Air  entry equal on both sides.  No rhonchi.  No rales.  Cardiac: Heart sounds are normal.  No pericardial rub.  No murmur. Lymphatic system: Cervical, axillary, inguinal, lymph nodes not palpable GI: Abdomen is soft.  No ascites.  Liver spleen not palpable.  No tenderness.  Bowel  sounds are within normal limit Lower extremity: No edema Neurological system: Higher functions, cranial nerves intact no evidence of peripheral neuropathy. Skin: No rash.  No ecchymosis.Marland Kitchen   LAB RESULTS:  CBC Latest Ref Rng 09/17/2015 03/18/2015  WBC 3.8 - 10.6 K/uL 4.6 4.3  Hemoglobin 13.0 - 18.0 g/dL 15.3 14.2  Hematocrit 40.0 - 52.0 % 44.9 41.3  Platelets 150 - 440 K/uL 128(L) 112(L)    Appointment on 09/17/2015  Component Date Value Ref Range Status  . WBC 09/17/2015 4.6  3.8 - 10.6 K/uL Final  . RBC 09/17/2015 4.90  4.40 - 5.90 MIL/uL Final  . Hemoglobin 09/17/2015 15.3  13.0 - 18.0 g/dL Final  . HCT 09/17/2015 44.9  40.0 - 52.0 % Final  . MCV 09/17/2015 91.7  80.0 - 100.0 fL Final  . MCH 09/17/2015 31.2  26.0 - 34.0 pg Final  . MCHC 09/17/2015 34.0  32.0 - 36.0 g/dL Final  . RDW 09/17/2015 14.1  11.5 - 14.5 % Final  . Platelets 09/17/2015 128* 150 - 440 K/uL Final  . Neutrophils Relative % 09/17/2015 40   Final  . Neutro Abs 09/17/2015 1.8  1.4 - 6.5 K/uL Final  . Lymphocytes Relative 09/17/2015 41   Final  . Lymphs Abs 09/17/2015 1.9  1.0 - 3.6 K/uL Final  . Monocytes Relative 09/17/2015 10   Final  . Monocytes Absolute 09/17/2015 0.4  0.2 - 1.0 K/uL Final  . Eosinophils Relative 09/17/2015 8   Final  . Eosinophils Absolute 09/17/2015 0.4  0 - 0.7 K/uL Final  . Basophils Relative 09/17/2015 1   Final  . Basophils Absolute 09/17/2015 0.0  0 - 0.1 K/uL Final  . Sodium 09/17/2015 136  135 - 145 mmol/L Final  . Potassium 09/17/2015 5.5* 3.5 - 5.1 mmol/L Final  . Chloride 09/17/2015 100* 101 - 111 mmol/L Final  . CO2 09/17/2015 30  22 - 32 mmol/L Final  . Glucose, Bld 09/17/2015 212* 65 - 99 mg/dL Final  . BUN 09/17/2015 10  6 - 20 mg/dL Final  . Creatinine, Ser 09/17/2015 1.05  0.61 - 1.24 mg/dL Final  . Calcium 09/17/2015 9.2  8.9 - 10.3 mg/dL Final  . Total Protein 09/17/2015 7.4  6.5 - 8.1 g/dL Final  . Albumin 09/17/2015 4.2  3.5 - 5.0 g/dL Final  . AST 09/17/2015 28   15 - 41 U/L Final  . ALT 09/17/2015 22  17 - 63 U/L Final  . Alkaline Phosphatase 09/17/2015 59  38 - 126 U/L Final  . Total Bilirubin 09/17/2015 1.5* 0.3 - 1.2 mg/dL Final  . GFR calc non Af Amer 09/17/2015 >60  >60 mL/min Final  . GFR calc Af Amer 09/17/2015 >60  >60 mL/min Final   Comment: (NOTE) The  eGFR has been calculated using the CKD EPI equation. This calculation has not been validated in all clinical situations. eGFR's persistently <60 mL/min signify possible Chronic Kidney Disease.   . Anion gap 09/17/2015 6  5 - 15 Final  . CEA 09/17/2015 1.6  0.0 - 4.7 ng/mL Final   Comment: (NOTE)       Roche ECLIA methodology       Nonsmokers  <3.9                                     Smokers     <5.6 Performed At: [facility] [place], [place] [id number] Lindon Romp MD OV:[id number]       ASSESSMENT: Carcinoma of colon that is on no evidence of recurrent disease on clinical ground.  CEA remains stable 1.6 Getting regular colonoscopy done Dizziness Etiology is not clear.  Extensive investigation with a neurologist and ENT physician Meclizine did not help Patient was given diazepam 5 mg twice a day for dizziness if it helps patient will contact primary care physician to continue.  If it does not help that symptom would be addressed by her primary care physician. At this point in time he has been discharged from my care as patient is more than 10 years from the history of colon cancer    Patient expressed understanding and was in agreement with this plan. He also understands that He can call clinic at any time with any questions, concerns, or complaints.    No matching staging information was found for the patient.  Forest Gleason, MD   09/20/2015 7:23 PM

## 2017-01-16 ENCOUNTER — Emergency Department: Payer: Medicare Other

## 2017-01-16 ENCOUNTER — Encounter
Admission: EM | Disposition: A | Discharge status: Home / Self Care | Payer: Self-pay | Source: Home / Self Care | Attending: Emergency Medicine

## 2017-01-16 ENCOUNTER — Emergency Department: Payer: Medicare Other | Admitting: Anesthesiology

## 2017-01-16 ENCOUNTER — Observation Stay
Admission: EM | Admit: 2017-01-16 | Discharge: 2017-01-17 | Disposition: A | Discharge status: Home / Self Care | Payer: Medicare Other | Attending: Surgery | Admitting: Surgery

## 2017-01-16 ENCOUNTER — Encounter: Payer: Self-pay | Admitting: Emergency Medicine

## 2017-01-16 DIAGNOSIS — Y92009 Unspecified place in unspecified non-institutional (private) residence as the place of occurrence of the external cause: Secondary | ICD-10-CM | POA: Insufficient documentation

## 2017-01-16 DIAGNOSIS — M795 Residual foreign body in soft tissue: Secondary | ICD-10-CM | POA: Diagnosis present

## 2017-01-16 DIAGNOSIS — Z1810 Retained metal fragments, unspecified: Secondary | ICD-10-CM | POA: Insufficient documentation

## 2017-01-16 DIAGNOSIS — E119 Type 2 diabetes mellitus without complications: Secondary | ICD-10-CM | POA: Diagnosis not present

## 2017-01-16 DIAGNOSIS — M23200 Derangement of unspecified lateral meniscus due to old tear or injury, right knee: Secondary | ICD-10-CM | POA: Insufficient documentation

## 2017-01-16 DIAGNOSIS — Z7982 Long term (current) use of aspirin: Secondary | ICD-10-CM | POA: Insufficient documentation

## 2017-01-16 DIAGNOSIS — S82111B Displaced fracture of right tibial spine, initial encounter for open fracture type I or II: Principal | ICD-10-CM | POA: Insufficient documentation

## 2017-01-16 DIAGNOSIS — S81032A Puncture wound without foreign body, left knee, initial encounter: Secondary | ICD-10-CM

## 2017-01-16 DIAGNOSIS — Y9389 Activity, other specified: Secondary | ICD-10-CM | POA: Diagnosis not present

## 2017-01-16 DIAGNOSIS — R262 Difficulty in walking, not elsewhere classified: Secondary | ICD-10-CM

## 2017-01-16 DIAGNOSIS — M23221 Derangement of posterior horn of medial meniscus due to old tear or injury, right knee: Secondary | ICD-10-CM | POA: Diagnosis not present

## 2017-01-16 DIAGNOSIS — Z7984 Long term (current) use of oral hypoglycemic drugs: Secondary | ICD-10-CM | POA: Diagnosis not present

## 2017-01-16 DIAGNOSIS — W3400XA Accidental discharge from unspecified firearms or gun, initial encounter: Secondary | ICD-10-CM | POA: Insufficient documentation

## 2017-01-16 DIAGNOSIS — Z79899 Other long term (current) drug therapy: Secondary | ICD-10-CM | POA: Diagnosis not present

## 2017-01-16 DIAGNOSIS — J449 Chronic obstructive pulmonary disease, unspecified: Secondary | ICD-10-CM | POA: Insufficient documentation

## 2017-01-16 DIAGNOSIS — Y249XXA Unspecified firearm discharge, undetermined intent, initial encounter: Secondary | ICD-10-CM

## 2017-01-16 DIAGNOSIS — Z87891 Personal history of nicotine dependence: Secondary | ICD-10-CM | POA: Diagnosis not present

## 2017-01-16 DIAGNOSIS — R4181 Age-related cognitive decline: Secondary | ICD-10-CM | POA: Diagnosis not present

## 2017-01-16 DIAGNOSIS — I10 Essential (primary) hypertension: Secondary | ICD-10-CM | POA: Insufficient documentation

## 2017-01-16 HISTORY — PX: KNEE ARTHROSCOPY: SHX127

## 2017-01-16 HISTORY — PX: IRRIGATION AND DEBRIDEMENT KNEE: SHX5185

## 2017-01-16 LAB — GLUCOSE, CAPILLARY
GLUCOSE-CAPILLARY: 102 mg/dL — AB (ref 65–99)
Glucose-Capillary: 96 mg/dL (ref 65–99)

## 2017-01-16 LAB — BASIC METABOLIC PANEL
Anion gap: 8 (ref 5–15)
BUN: 7 mg/dL (ref 6–20)
CO2: 25 mmol/L (ref 22–32)
CREATININE: 0.92 mg/dL (ref 0.61–1.24)
Calcium: 8.6 mg/dL — ABNORMAL LOW (ref 8.9–10.3)
Chloride: 102 mmol/L (ref 101–111)
Glucose, Bld: 89 mg/dL (ref 65–99)
Potassium: 4.2 mmol/L (ref 3.5–5.1)
SODIUM: 135 mmol/L (ref 135–145)

## 2017-01-16 LAB — CBC
HCT: 32.5 % — ABNORMAL LOW (ref 40.0–52.0)
Hemoglobin: 11.6 g/dL — ABNORMAL LOW (ref 13.0–18.0)
MCH: 32 pg (ref 26.0–34.0)
MCHC: 35.7 g/dL (ref 32.0–36.0)
MCV: 89.8 fL (ref 80.0–100.0)
PLATELETS: 229 10*3/uL (ref 150–440)
RBC: 3.62 MIL/uL — ABNORMAL LOW (ref 4.40–5.90)
RDW: 13.2 % (ref 11.5–14.5)
WBC: 5 10*3/uL (ref 3.8–10.6)

## 2017-01-16 LAB — PROTIME-INR
INR: 0.99
PROTHROMBIN TIME: 13.1 s (ref 11.4–15.2)

## 2017-01-16 LAB — APTT: aPTT: 33 seconds (ref 24–36)

## 2017-01-16 SURGERY — IRRIGATION AND DEBRIDEMENT KNEE
Anesthesia: General | Site: Knee | Laterality: Right

## 2017-01-16 MED ORDER — ENOXAPARIN SODIUM 40 MG/0.4ML ~~LOC~~ SOLN
40.0000 mg | SUBCUTANEOUS | Status: DC
  Administered 2017-01-17: 40 mg via SUBCUTANEOUS
  Filled 2017-01-16: qty 0.4

## 2017-01-16 MED ORDER — SODIUM CHLORIDE 0.9 % IV BOLUS (SEPSIS)
1000.0000 mL | Freq: Once | INTRAVENOUS | Status: AC
  Administered 2017-01-16: 1000 mL via INTRAVENOUS

## 2017-01-16 MED ORDER — CEFAZOLIN SODIUM-DEXTROSE 2-4 GM/100ML-% IV SOLN
2.0000 g | Freq: Four times a day (QID) | INTRAVENOUS | Status: DC
  Filled 2017-01-16 (×3): qty 100

## 2017-01-16 MED ORDER — ACETAMINOPHEN 325 MG PO TABS: 650.0000 mg | ORAL_TABLET | Freq: Four times a day (QID) | ORAL | Status: DC | PRN

## 2017-01-16 MED ORDER — CEFAZOLIN SODIUM 1 G IJ SOLR
INTRAMUSCULAR | Status: AC
  Filled 2017-01-16: qty 10

## 2017-01-16 MED ORDER — SUGAMMADEX SODIUM 200 MG/2ML IV SOLN
INTRAVENOUS | Status: DC | PRN
Start: 2017-01-16 — End: 2017-01-16
  Administered 2017-01-16: 174 mg via INTRAVENOUS

## 2017-01-16 MED ORDER — FENTANYL CITRATE (PF) 100 MCG/2ML IJ SOLN
INTRAMUSCULAR | Status: AC
  Filled 2017-01-16: qty 2

## 2017-01-16 MED ORDER — ROCURONIUM BROMIDE 100 MG/10ML IV SOLN
INTRAVENOUS | Status: DC | PRN
  Administered 2017-01-16: 30 mg via INTRAVENOUS
  Administered 2017-01-16: 20 mg via INTRAVENOUS

## 2017-01-16 MED ORDER — MECLIZINE HCL 25 MG PO TABS
25.0000 mg | ORAL_TABLET | Freq: Every day | ORAL | Status: DC
  Administered 2017-01-17: 25 mg via ORAL
  Filled 2017-01-16: qty 1

## 2017-01-16 MED ORDER — ACETAMINOPHEN 500 MG PO TABS
1000.0000 mg | ORAL_TABLET | Freq: Four times a day (QID) | ORAL | Status: DC
  Administered 2017-01-17 (×2): 1000 mg via ORAL
  Filled 2017-01-16 (×2): qty 2

## 2017-01-16 MED ORDER — OXYCODONE HCL 5 MG PO TABS: 5.0000 mg | ORAL_TABLET | Freq: Once | ORAL | Status: DC | PRN

## 2017-01-16 MED ORDER — ONDANSETRON HCL 4 MG/2ML IJ SOLN
INTRAMUSCULAR | Status: DC | PRN
  Administered 2017-01-16: 4 mg via INTRAVENOUS

## 2017-01-16 MED ORDER — SUCCINYLCHOLINE CHLORIDE 20 MG/ML IJ SOLN
INTRAMUSCULAR | Status: DC | PRN
  Administered 2017-01-16: 100 mg via INTRAVENOUS

## 2017-01-16 MED ORDER — ONDANSETRON HCL 4 MG/2ML IJ SOLN: 4.0000 mg | Freq: Four times a day (QID) | INTRAMUSCULAR | Status: DC | PRN

## 2017-01-16 MED ORDER — LIDOCAINE HCL (CARDIAC) 20 MG/ML IV SOLN
INTRAVENOUS | Status: DC | PRN
  Administered 2017-01-16: 100 mg via INTRAVENOUS

## 2017-01-16 MED ORDER — ASPIRIN EC 81 MG PO TBEC
81.0000 mg | DELAYED_RELEASE_TABLET | Freq: Every day | ORAL | Status: DC
  Administered 2017-01-17: 81 mg via ORAL
  Filled 2017-01-16: qty 1

## 2017-01-16 MED ORDER — DIPHENHYDRAMINE HCL 12.5 MG/5ML PO ELIX: 12.5000 mg | ORAL_SOLUTION | ORAL | Status: DC | PRN

## 2017-01-16 MED ORDER — POTASSIUM CHLORIDE IN NACL 20-0.9 MEQ/L-% IV SOLN
INTRAVENOUS | Status: DC
  Administered 2017-01-16: 23:00:00 via INTRAVENOUS
  Filled 2017-01-16 (×3): qty 1000

## 2017-01-16 MED ORDER — METOCLOPRAMIDE HCL 10 MG PO TABS: 5.0000 mg | ORAL_TABLET | Freq: Three times a day (TID) | ORAL | Status: DC | PRN

## 2017-01-16 MED ORDER — FENTANYL CITRATE (PF) 100 MCG/2ML IJ SOLN
25.0000 ug | INTRAMUSCULAR | Status: DC | PRN
  Administered 2017-01-16 (×2): 25 ug via INTRAVENOUS

## 2017-01-16 MED ORDER — DOCUSATE SODIUM 100 MG PO CAPS
100.0000 mg | ORAL_CAPSULE | Freq: Two times a day (BID) | ORAL | Status: DC
  Administered 2017-01-16 – 2017-01-17 (×2): 100 mg via ORAL
  Filled 2017-01-16 (×2): qty 1

## 2017-01-16 MED ORDER — SUCCINYLCHOLINE CHLORIDE 200 MG/10ML IV SOSY
PREFILLED_SYRINGE | INTRAVENOUS | Status: AC
  Filled 2017-01-16: qty 10

## 2017-01-16 MED ORDER — BISACODYL 10 MG RE SUPP: 10.0000 mg | Freq: Every day | RECTAL | Status: DC | PRN

## 2017-01-16 MED ORDER — PRAVASTATIN SODIUM 20 MG PO TABS
20.0000 mg | ORAL_TABLET | Freq: Every day | ORAL | Status: DC
  Administered 2017-01-17: 20 mg via ORAL
  Filled 2017-01-16: qty 1

## 2017-01-16 MED ORDER — ACETAMINOPHEN 650 MG RE SUPP: 650.0000 mg | Freq: Four times a day (QID) | RECTAL | Status: DC | PRN

## 2017-01-16 MED ORDER — ONDANSETRON HCL 4 MG PO TABS: 4.0000 mg | ORAL_TABLET | Freq: Four times a day (QID) | ORAL | Status: DC | PRN

## 2017-01-16 MED ORDER — METOCLOPRAMIDE HCL 5 MG/ML IJ SOLN: 5.0000 mg | Freq: Three times a day (TID) | INTRAMUSCULAR | Status: DC | PRN

## 2017-01-16 MED ORDER — ALBUTEROL SULFATE (2.5 MG/3ML) 0.083% IN NEBU: 3.0000 mL | INHALATION_SOLUTION | Freq: Four times a day (QID) | RESPIRATORY_TRACT | Status: DC | PRN

## 2017-01-16 MED ORDER — CEFAZOLIN SODIUM-DEXTROSE 2-3 GM-% IV SOLR
2.0000 g | Freq: Four times a day (QID) | INTRAVENOUS | Status: AC
Start: 2017-01-16 — End: 2017-01-17
  Administered 2017-01-16 – 2017-01-17 (×3): 2 g via INTRAVENOUS
  Filled 2017-01-16 (×3): qty 50

## 2017-01-16 MED ORDER — OXYCODONE HCL 5 MG/5ML PO SOLN: 5.0000 mg | Freq: Once | ORAL | Status: DC | PRN

## 2017-01-16 MED ORDER — MAGNESIUM HYDROXIDE 400 MG/5ML PO SUSP: 30.0000 mL | Freq: Every day | ORAL | Status: DC | PRN

## 2017-01-16 MED ORDER — DIAZEPAM 2 MG PO TABS
2.0000 mg | ORAL_TABLET | Freq: Two times a day (BID) | ORAL | Status: DC | PRN
Start: 2017-01-16 — End: 2017-01-17

## 2017-01-16 MED ORDER — MIDAZOLAM HCL 2 MG/2ML IJ SOLN
INTRAMUSCULAR | Status: AC
  Filled 2017-01-16: qty 2

## 2017-01-16 MED ORDER — BUPIVACAINE HCL (PF) 0.5 % IJ SOLN
INTRAMUSCULAR | Status: DC | PRN
  Administered 2017-01-16: 20 mL

## 2017-01-16 MED ORDER — FENTANYL CITRATE (PF) 100 MCG/2ML IJ SOLN
INTRAMUSCULAR | Status: DC | PRN
  Administered 2017-01-16 (×4): 50 ug via INTRAVENOUS

## 2017-01-16 MED ORDER — METFORMIN HCL ER 500 MG PO TB24
1000.0000 mg | ORAL_TABLET | Freq: Two times a day (BID) | ORAL | Status: DC
  Administered 2017-01-17: 1000 mg via ORAL
  Filled 2017-01-16: qty 2

## 2017-01-16 MED ORDER — CEFAZOLIN SODIUM-DEXTROSE 2-4 GM/100ML-% IV SOLN
2.0000 g | INTRAVENOUS | Status: AC
  Administered 2017-01-16: 2 g via INTRAVENOUS

## 2017-01-16 MED ORDER — IPRATROPIUM-ALBUTEROL 0.5-2.5 (3) MG/3ML IN SOLN
3.0000 mL | Freq: Once | RESPIRATORY_TRACT | Status: AC
  Administered 2017-01-16: 3 mL via RESPIRATORY_TRACT

## 2017-01-16 MED ORDER — ONDANSETRON HCL 4 MG/2ML IJ SOLN
INTRAMUSCULAR | Status: AC
  Filled 2017-01-16: qty 2

## 2017-01-16 MED ORDER — LACTATED RINGERS IR SOLN
Status: DC | PRN
  Administered 2017-01-16: 3000 mL

## 2017-01-16 MED ORDER — NEOMYCIN-POLYMYXIN B GU 40-200000 IR SOLN
Status: DC | PRN
  Administered 2017-01-16: 12 mL

## 2017-01-16 MED ORDER — FLEET ENEMA 7-19 GM/118ML RE ENEM: 1.0000 | ENEMA | Freq: Once | RECTAL | Status: DC | PRN

## 2017-01-16 MED ORDER — OXYCODONE-ACETAMINOPHEN 5-325 MG PO TABS: 1.0000 | ORAL_TABLET | Freq: Once | ORAL | Status: DC

## 2017-01-16 MED ORDER — OXYCODONE HCL 5 MG PO TABS: 5.0000 mg | ORAL_TABLET | ORAL | Status: DC | PRN

## 2017-01-16 MED ORDER — LIDOCAINE HCL (PF) 2 % IJ SOLN
INTRAMUSCULAR | Status: AC
Start: 2017-01-16 — End: 2017-01-16
  Filled 2017-01-16: qty 2

## 2017-01-16 MED ORDER — LACTATED RINGERS IV SOLN
INTRAVENOUS | Status: DC | PRN
  Administered 2017-01-16: 19:00:00 via INTRAVENOUS

## 2017-01-16 MED ORDER — MIDAZOLAM HCL 2 MG/2ML IJ SOLN
INTRAMUSCULAR | Status: DC | PRN
  Administered 2017-01-16: 2 mg via INTRAVENOUS

## 2017-01-16 MED ORDER — PROPOFOL 10 MG/ML IV BOLUS
INTRAVENOUS | Status: DC | PRN
  Administered 2017-01-16: 150 mg via INTRAVENOUS

## 2017-01-16 MED ORDER — PROPOFOL 10 MG/ML IV BOLUS
INTRAVENOUS | Status: AC
  Filled 2017-01-16: qty 20

## 2017-01-16 MED ORDER — ACETAMINOPHEN 500 MG PO TABS
1000.0000 mg | ORAL_TABLET | Freq: Once | ORAL | Status: AC
  Administered 2017-01-16: 1000 mg via ORAL
  Filled 2017-01-16: qty 2

## 2017-01-16 MED ORDER — SUGAMMADEX SODIUM 200 MG/2ML IV SOLN
INTRAVENOUS | Status: AC
  Filled 2017-01-16: qty 2

## 2017-01-16 SURGICAL SUPPLY — 34 items
BAG COUNTER SPONGE EZ (MISCELLANEOUS) IMPLANT
BANDAGE ACE 6X5 VEL STRL LF (GAUZE/BANDAGES/DRESSINGS) ×4 IMPLANT
BLADE FULL RADIUS 3.5 (BLADE) ×4 IMPLANT
BLADE SHAVER 4.5X7 STR FR (MISCELLANEOUS) IMPLANT
CHLORAPREP W/TINT 26ML (MISCELLANEOUS) ×4 IMPLANT
COUNTER SPONGE BAG EZ (MISCELLANEOUS)
CUFF TOURN 24 STER (MISCELLANEOUS) ×4 IMPLANT
CUFF TOURN 30 STER DUAL PORT (MISCELLANEOUS) IMPLANT
DRAPE IMP U-DRAPE 54X76 (DRAPES) ×4 IMPLANT
ELECT REM PT RETURN 9FT ADLT (ELECTROSURGICAL) ×4
ELECTRODE REM PT RTRN 9FT ADLT (ELECTROSURGICAL) ×2 IMPLANT
GAUZE SPONGE 4X4 12PLY STRL (GAUZE/BANDAGES/DRESSINGS) ×4 IMPLANT
GLOVE BIO SURGEON STRL SZ8 (GLOVE) ×8 IMPLANT
GLOVE BIOGEL M 7.0 STRL (GLOVE) ×8 IMPLANT
GLOVE BIOGEL PI IND STRL 7.5 (GLOVE) IMPLANT
GLOVE BIOGEL PI INDICATOR 7.5 (GLOVE)
GLOVE INDICATOR 8.0 STRL GRN (GLOVE) ×4 IMPLANT
GOWN STRL REUS W/ TWL LRG LVL3 (GOWN DISPOSABLE) ×4 IMPLANT
GOWN STRL REUS W/ TWL XL LVL3 (GOWN DISPOSABLE) ×2 IMPLANT
GOWN STRL REUS W/TWL LRG LVL3 (GOWN DISPOSABLE) ×4
GOWN STRL REUS W/TWL XL LVL3 (GOWN DISPOSABLE) ×2
HEMOVAC 400CC 10FR (MISCELLANEOUS) ×4 IMPLANT
IV LACTATED RINGER IRRG 3000ML (IV SOLUTION) ×4
IV LR IRRIG 3000ML ARTHROMATIC (IV SOLUTION) ×4 IMPLANT
KIT RM TURNOVER STRD PROC AR (KITS) ×4 IMPLANT
MANIFOLD NEPTUNE II (INSTRUMENTS) ×8 IMPLANT
NEEDLE HYPO 21X1.5 SAFETY (NEEDLE) ×4 IMPLANT
PACK ARTHROSCOPY KNEE (MISCELLANEOUS) ×4 IMPLANT
PENCIL ELECTRO HAND CTR (MISCELLANEOUS) ×4 IMPLANT
SUT PROLENE 4 0 PS 2 18 (SUTURE) ×4 IMPLANT
SUT TICRON COATED BLUE 2 0 30 (SUTURE) IMPLANT
SYR 50ML LL SCALE MARK (SYRINGE) ×4 IMPLANT
TUBING ARTHRO INFLOW-ONLY STRL (TUBING) ×4 IMPLANT
WAND HAND CNTRL MULTIVAC 90 (MISCELLANEOUS) ×4 IMPLANT
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
## 2017-01-16 NOTE — Anesthesia Preprocedure Evaluation (Addendum)
Anesthesia Evaluation  Patient identified by MRN, date of birth, ID band Patient awake    Reviewed: Allergy & Precautions, H&P , NPO status , Patient's Chart, lab work & pertinent test results  History of Anesthesia Complications Negative for: history of anesthetic complications  Airway Mallampati: III  TM Distance: >3 FB Neck ROM: limited    Dental no notable dental hx. (+) Poor Dentition, Missing, Upper Dentures, Lower Dentures   Pulmonary shortness of breath and with exertion, COPD, former smoker,    Pulmonary exam normal breath sounds clear to auscultation       Cardiovascular Exercise Tolerance: Good hypertension, (-) angina(-) Past MI and (-) DOE Normal cardiovascular exam Rhythm:regular Rate:Normal     Neuro/Psych  Headaches,  Neuromuscular disease negative psych ROS   GI/Hepatic negative GI ROS, Neg liver ROS, neg GERD  ,  Endo/Other  diabetes, Type 2, Oral Hypoglycemic Agents  Renal/GU      Musculoskeletal   Abdominal   Peds  Hematology negative hematology ROS (+)   Anesthesia Other Findings Past Medical History: No date: Cancer ([place])     Comment: colon No date: Diabetes mellitus without complication ([place]) [id number]: Generalized headaches     Comment: pt reported  Past Surgical History: No date: EYE SURGERY     Comment: lens implant right eye No date: HERNIA REPAIR     Reproductive/Obstetrics negative OB ROS                             Anesthesia Physical Anesthesia Plan  ASA: III and emergent  Anesthesia Plan: General ETT   Post-op Pain Management:    Induction:   Airway Management Planned:   Additional Equipment:   Intra-op Plan:   Post-operative Plan:   Informed Consent: I have reviewed the patients History and Physical, chart, labs and discussed the procedure including the risks, benefits and alternatives for the proposed anesthesia with the patient or  authorized representative who has indicated his/her understanding and acceptance.   Dental Advisory Given  Plan Discussed with: Anesthesiologist, CRNA and Surgeon  Anesthesia Plan Comments: (Patient last ate at 1030 and then had a beer at 1400.  Spoke with Dr. Roland Rack, he will be declaring that case an emergency because he feels that the patients risk for infection increase the longer that we wait.)       Anesthesia Quick Evaluation

## 2017-01-16 NOTE — Anesthesia Procedure Notes (Signed)
Performed by: Olivine Hiers       

## 2017-01-16 NOTE — Transfer of Care (Signed)
Immediate Anesthesia Transfer of Care Note  Patient: Bryan Buck  Procedure(s) Performed: Procedure(s): IRRIGATION AND DEBRIDEMENT KNEE (Right) ARTHROSCOPY KNEE (Right)  Patient Location: PACU  Anesthesia Type:General  Level of Consciousness: awake, alert , oriented and patient cooperative  Airway & Oxygen Therapy: Patient Spontanous Breathing  Post-op Assessment: Report given to RN and Post -op Vital signs reviewed and stable  Post vital signs: Reviewed and stable  Last Vitals:  Vitals:   01/16/17 1700 01/16/17 1730  BP: 131/78   Pulse: 67 77  Resp:      Last Pain:  Vitals:   01/16/17 1430  TempSrc: Oral  PainSc: 2          Complications: No apparent anesthesia complications
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
## 2017-01-16 NOTE — ED Notes (Signed)
Dialysis Clamp placed on wound site to control bleeding at this time
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
## 2017-01-16 NOTE — Anesthesia Procedure Notes (Signed)
Procedure Name: Intubation Date/Time: 01/16/2017 7:06 PM Performed by: Lendon Colonel Pre-anesthesia Checklist: Patient identified, Patient being monitored, Timeout performed, Emergency Drugs available and Suction available Patient Re-evaluated:Patient Re-evaluated prior to inductionOxygen Delivery Method: Circle system utilized Preoxygenation: Pre-oxygenation with 100% oxygen Intubation Type: IV induction and Rapid sequence Laryngoscope Size: Miller and 2 Grade View: Grade I Tube type: Oral Tube size: 7.5 mm Number of attempts: 1 Airway Equipment and Method: Stylet Placement Confirmation: ETT inserted through vocal cords under direct vision,  positive ETCO2 and breath sounds checked- equal and bilateral Secured at: 21 cm Tube secured with: Tape Dental Injury: Teeth and Oropharynx as per pre-operative assessment

## 2017-01-16 NOTE — Anesthesia Post-op Follow-up Note (Cosign Needed)
Anesthesia QCDR form completed.        
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
## 2017-01-17 ENCOUNTER — Encounter: Payer: Self-pay | Admitting: Surgery

## 2017-01-17 DIAGNOSIS — S82111B Displaced fracture of right tibial spine, initial encounter for open fracture type I or II: Secondary | ICD-10-CM | POA: Diagnosis not present

## 2017-01-17 LAB — BASIC METABOLIC PANEL
Anion gap: 6 (ref 5–15)
BUN: 9 mg/dL (ref 6–20)
CHLORIDE: 102 mmol/L (ref 101–111)
CO2: 27 mmol/L (ref 22–32)
Calcium: 8.3 mg/dL — ABNORMAL LOW (ref 8.9–10.3)
Creatinine, Ser: 0.96 mg/dL (ref 0.61–1.24)
GFR calc Af Amer: >60 mL/min (ref >60)
GFR calc non Af Amer: >60 mL/min (ref >60)
Glucose, Bld: 174 mg/dL — ABNORMAL HIGH (ref 65–99)
Potassium: 4.7 mmol/L (ref 3.5–5.1)
Sodium: 135 mmol/L (ref 135–145)

## 2017-01-17 MED ORDER — OXYCODONE HCL 5 MG PO TABS: 5.0000 mg | ORAL_TABLET | ORAL | 0 refills | Status: DC | PRN

## 2017-01-17 MED ORDER — TRAMADOL HCL 50 MG PO TABS: 50.0000 mg | ORAL_TABLET | Freq: Four times a day (QID) | ORAL | 0 refills | Status: DC | PRN

## 2017-01-17 MED ORDER — CEPHALEXIN 500 MG PO CAPS: 500.0000 mg | ORAL_CAPSULE | Freq: Four times a day (QID) | ORAL | 0 refills | Status: DC
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
## 2017-01-17 NOTE — Care Management Note (Addendum)
Case Management Note  Patient Details  Name: Bryan Buck MRN: [id number] Date of Birth: Jul 11, 1934  Subjective/Objective:                  Spoke with patient regarding home health. He states he has "walked the block with PT". His PCP is Dr. Frazier Richards. Patient refused home health services "because I live alone and my house is just a mess".  Patient states he will drive himself to appointments and indicated he will not be home bound. He uses [facility] medications. He states he is independent and will not need any support. He has friends "but they can't hardly take care of themselves. He has a front-wheeled walker in his closet. Action/Plan: Patient declined case management involved. Dr. Roland Rack updated.  Expected Discharge Date:                  Expected Discharge Plan:     In-House Referral:     Discharge planning Services  CM Consult  Post Acute Care Choice:  Home Health, Durable Medical Equipment Choice offered to:  Patient  DME Arranged:    DME Agency:     HH Arranged:    [place] Agency:     Status of Service:  In process, will continue to follow  If discussed at Long Length of Stay Meetings, dates discussed:    Additional Comments:  Marshell Garfinkel, RN 01/17/2017, 9:17 AM
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
## 2017-01-17 NOTE — Progress Notes (Signed)
Pt being discharged home today. PIV removed. Discharge instructions reviewed with pt, all questions answered. He was given prescriptions to have filled, and follow up appointment has been scheduled. He is leaving with all his belongings, he will drive himself home from hospital.

## 2017-01-17 NOTE — Care Management Obs Status (Signed)
[place] NOTIFICATION   Patient Details  Name: Bryan Buck MRN: [id number] Date of Birth: Apr 26, 1934   Medicare Observation Status Notification Given:  No  Less than 24 hours  Marshell Garfinkel, RN 01/17/2017, 11:10 AM

## 2017-01-17 NOTE — Progress Notes (Signed)
Clinical Education officer, museum (CSW) received SNF consult. PT is recommending that patient can return home. RN case manager aware of above. Please reconsult if future social work needs arise. CSW signing off.   [employer], LCSW [phone]

## 2017-01-17 NOTE — Discharge Instructions (Signed)
-  Weightbear as tolerated to right knee while in knee immobilizers. -Oxycodone for severe pain, try to use primarily tramadol and tylenol -Continue Asa 81 mg daily -Keep dressing dry and intact. -Follow-up with [facility] in 10 days for suture removal.

## 2017-01-17 NOTE — Anesthesia Postprocedure Evaluation (Signed)
Anesthesia Post Note  Patient: Bryan Buck  Procedure(s) Performed: Procedure(s) (LRB): IRRIGATION AND DEBRIDEMENT KNEE (Right) ARTHROSCOPY KNEE (Right)  Patient location during evaluation: PACU Anesthesia Type: General Level of consciousness: awake and alert Pain management: pain level controlled Vital Signs Assessment: post-procedure vital signs reviewed and stable Respiratory status: spontaneous breathing, nonlabored ventilation, respiratory function stable and patient connected to nasal cannula oxygen Cardiovascular status: blood pressure returned to baseline and stable Postop Assessment: no signs of nausea or vomiting Anesthetic complications: no     Last Vitals:  Vitals:   01/16/17 2113 01/16/17 2127  BP:  (!) 148/59  Pulse: 72 71  Resp: 15 18  Temp:  36.6 C    Last Pain:  Vitals:   01/16/17 2127  TempSrc: Oral  PainSc:                  Precious Haws Piscitello

## 2017-08-29 ENCOUNTER — Ambulatory Visit: Payer: Self-pay | Admitting: Urology

## 2019-11-25 ENCOUNTER — Other Ambulatory Visit: Payer: Self-pay

## 2019-11-25 ENCOUNTER — Inpatient Hospital Stay: Payer: Medicare Other

## 2019-11-25 ENCOUNTER — Emergency Department: Payer: Medicare Other

## 2019-11-25 ENCOUNTER — Inpatient Hospital Stay
Admission: EM | Admit: 2019-11-25 | Discharge: 2019-12-16 | DRG: 080 | Disposition: A | Discharge status: Skilled Nursing Facility | Payer: Medicare Other | Attending: Internal Medicine | Admitting: Internal Medicine

## 2019-11-25 DIAGNOSIS — Z7982 Long term (current) use of aspirin: Secondary | ICD-10-CM

## 2019-11-25 DIAGNOSIS — Z85038 Personal history of other malignant neoplasm of large intestine: Secondary | ICD-10-CM | POA: Diagnosis not present

## 2019-11-25 DIAGNOSIS — Z515 Encounter for palliative care: Secondary | ICD-10-CM | POA: Diagnosis not present

## 2019-11-25 DIAGNOSIS — I10 Essential (primary) hypertension: Secondary | ICD-10-CM | POA: Diagnosis present

## 2019-11-25 DIAGNOSIS — E785 Hyperlipidemia, unspecified: Secondary | ICD-10-CM | POA: Diagnosis present

## 2019-11-25 DIAGNOSIS — U071 COVID-19: Secondary | ICD-10-CM | POA: Diagnosis present

## 2019-11-25 DIAGNOSIS — R410 Disorientation, unspecified: Secondary | ICD-10-CM | POA: Diagnosis not present

## 2019-11-25 DIAGNOSIS — D61818 Other pancytopenia: Secondary | ICD-10-CM

## 2019-11-25 DIAGNOSIS — Z66 Do not resuscitate: Secondary | ICD-10-CM | POA: Diagnosis present

## 2019-11-25 DIAGNOSIS — N390 Urinary tract infection, site not specified: Secondary | ICD-10-CM | POA: Diagnosis not present

## 2019-11-25 DIAGNOSIS — Z7984 Long term (current) use of oral hypoglycemic drugs: Secondary | ICD-10-CM | POA: Diagnosis not present

## 2019-11-25 DIAGNOSIS — G936 Cerebral edema: Principal | ICD-10-CM

## 2019-11-25 DIAGNOSIS — Z862 Personal history of diseases of the blood and blood-forming organs and certain disorders involving the immune mechanism: Secondary | ICD-10-CM | POA: Diagnosis not present

## 2019-11-25 DIAGNOSIS — K7200 Acute and subacute hepatic failure without coma: Secondary | ICD-10-CM | POA: Diagnosis not present

## 2019-11-25 DIAGNOSIS — C7931 Secondary malignant neoplasm of brain: Secondary | ICD-10-CM | POA: Diagnosis not present

## 2019-11-25 DIAGNOSIS — J449 Chronic obstructive pulmonary disease, unspecified: Secondary | ICD-10-CM | POA: Diagnosis present

## 2019-11-25 DIAGNOSIS — E1169 Type 2 diabetes mellitus with other specified complication: Secondary | ICD-10-CM | POA: Diagnosis present

## 2019-11-25 DIAGNOSIS — Z8673 Personal history of transient ischemic attack (TIA), and cerebral infarction without residual deficits: Secondary | ICD-10-CM | POA: Diagnosis not present

## 2019-11-25 DIAGNOSIS — Z79899 Other long term (current) drug therapy: Secondary | ICD-10-CM | POA: Diagnosis not present

## 2019-11-25 DIAGNOSIS — E119 Type 2 diabetes mellitus without complications: Secondary | ICD-10-CM

## 2019-11-25 DIAGNOSIS — R4689 Other symptoms and signs involving appearance and behavior: Secondary | ICD-10-CM | POA: Diagnosis present

## 2019-11-25 DIAGNOSIS — G309 Alzheimer's disease, unspecified: Secondary | ICD-10-CM | POA: Diagnosis present

## 2019-11-25 DIAGNOSIS — H9190 Unspecified hearing loss, unspecified ear: Secondary | ICD-10-CM | POA: Diagnosis present

## 2019-11-25 DIAGNOSIS — F0280 Dementia in other diseases classified elsewhere without behavioral disturbance: Secondary | ICD-10-CM | POA: Diagnosis present

## 2019-11-25 DIAGNOSIS — D496 Neoplasm of unspecified behavior of brain: Secondary | ICD-10-CM | POA: Diagnosis not present

## 2019-11-25 DIAGNOSIS — R4182 Altered mental status, unspecified: Secondary | ICD-10-CM | POA: Diagnosis present

## 2019-11-25 DIAGNOSIS — Z87891 Personal history of nicotine dependence: Secondary | ICD-10-CM | POA: Diagnosis not present

## 2019-11-25 DIAGNOSIS — F0281 Dementia in other diseases classified elsewhere with behavioral disturbance: Secondary | ICD-10-CM | POA: Diagnosis not present

## 2019-11-25 LAB — COMPREHENSIVE METABOLIC PANEL
ALT: 13 U/L (ref 0–44)
AST: 18 U/L (ref 15–41)
Albumin: 3.8 g/dL (ref 3.5–5.0)
Alkaline Phosphatase: 50 U/L (ref 38–126)
Anion gap: 13 (ref 5–15)
BUN: 13 mg/dL (ref 8–23)
CO2: 24 mmol/L (ref 22–32)
Calcium: 9.6 mg/dL (ref 8.9–10.3)
Chloride: 99 mmol/L (ref 98–111)
Creatinine, Ser: 1.14 mg/dL (ref 0.61–1.24)
GFR calc Af Amer: >60 mL/min (ref >60)
GFR calc non Af Amer: 59 mL/min — ABNORMAL LOW (ref >60)
Glucose, Bld: 106 mg/dL — ABNORMAL HIGH (ref 70–99)
Potassium: 4 mmol/L (ref 3.5–5.1)
Sodium: 136 mmol/L (ref 135–145)
Total Bilirubin: 1.2 mg/dL (ref 0.3–1.2)
Total Protein: 7.2 g/dL (ref 6.5–8.1)

## 2019-11-25 LAB — CBC WITH DIFFERENTIAL/PLATELET
Abs Immature Granulocytes: 0.01 10*3/uL (ref 0.00–0.07)
Basophils Absolute: 0 10*3/uL (ref 0.0–0.1)
Basophils Relative: 1 %
Eosinophils Absolute: 0.2 10*3/uL (ref 0.0–0.5)
Eosinophils Relative: 4 %
HCT: 37.8 % — ABNORMAL LOW (ref 39.0–52.0)
Hemoglobin: 12.9 g/dL — ABNORMAL LOW (ref 13.0–17.0)
Immature Granulocytes: 0 %
Lymphocytes Relative: 21 %
Lymphs Abs: 1.3 10*3/uL (ref 0.7–4.0)
MCH: 31.2 pg (ref 26.0–34.0)
MCHC: 34.1 g/dL (ref 30.0–36.0)
MCV: 91.5 fL (ref 80.0–100.0)
Monocytes Absolute: 0.5 10*3/uL (ref 0.1–1.0)
Monocytes Relative: 8 %
Neutro Abs: 3.9 10*3/uL (ref 1.7–7.7)
Neutrophils Relative %: 66 %
Platelets: 166 10*3/uL (ref 150–400)
RBC: 4.13 MIL/uL — ABNORMAL LOW (ref 4.22–5.81)
RDW: 13.4 % (ref 11.5–15.5)
WBC: 5.9 10*3/uL (ref 4.0–10.5)
nRBC: 0 % (ref 0.0–0.2)

## 2019-11-25 LAB — URINE DRUG SCREEN, QUALITATIVE (ARMC ONLY)
Amphetamines, Ur Screen: NOT DETECTED
Barbiturates, Ur Screen: NOT DETECTED
Benzodiazepine, Ur Scrn: NOT DETECTED
Cannabinoid 50 Ng, Ur ~~LOC~~: NOT DETECTED
Cocaine Metabolite,Ur ~~LOC~~: NOT DETECTED
MDMA (Ecstasy)Ur Screen: NOT DETECTED
Methadone Scn, Ur: NOT DETECTED
Opiate, Ur Screen: NOT DETECTED
Phencyclidine (PCP) Ur S: NOT DETECTED
Tricyclic, Ur Screen: NOT DETECTED

## 2019-11-25 LAB — URINALYSIS, COMPLETE (UACMP) WITH MICROSCOPIC
Bilirubin Urine: NEGATIVE
Glucose, UA: NEGATIVE mg/dL
Ketones, ur: 5 mg/dL — AB
Nitrite: POSITIVE — AB
Protein, ur: NEGATIVE mg/dL
Specific Gravity, Urine: >1.046 — ABNORMAL HIGH (ref 1.005–1.030)
Squamous Epithelial / LPF: NONE SEEN (ref 0–5)
WBC, UA: >50 WBC/hpf — ABNORMAL HIGH (ref 0–5)
pH: 5 (ref 5.0–8.0)

## 2019-11-25 LAB — TROPONIN I (HIGH SENSITIVITY)
Troponin I (High Sensitivity): 6 ng/L (ref <18)
Troponin I (High Sensitivity): 5 ng/L (ref <18)

## 2019-11-25 LAB — GLUCOSE, CAPILLARY: Glucose-Capillary: 159 mg/dL — ABNORMAL HIGH (ref 70–99)

## 2019-11-25 LAB — ETHANOL: Alcohol, Ethyl (B): <10 mg/dL (ref <10)

## 2019-11-25 MED ORDER — PRAVASTATIN SODIUM 20 MG PO TABS
20.0000 mg | ORAL_TABLET | Freq: Every day | ORAL | Status: DC
  Administered 2019-11-26 – 2019-11-28 (×3): 20 mg via ORAL
  Filled 2019-11-25 (×4): qty 1

## 2019-11-25 MED ORDER — IOHEXOL 300 MG/ML  SOLN
100.0000 mL | Freq: Once | INTRAMUSCULAR | Status: AC | PRN
  Administered 2019-11-25: 16:00:00 100 mL via INTRAVENOUS

## 2019-11-25 MED ORDER — GADOBUTROL 1 MMOL/ML IV SOLN
8.0000 mL | Freq: Once | INTRAVENOUS | Status: AC | PRN
  Administered 2019-11-25: 18:00:00 8 mL via INTRAVENOUS

## 2019-11-25 MED ORDER — ALBUTEROL SULFATE HFA 108 (90 BASE) MCG/ACT IN AERS
2.0000 | INHALATION_SPRAY | Freq: Four times a day (QID) | RESPIRATORY_TRACT | Status: DC | PRN
  Filled 2019-11-25: qty 6.7

## 2019-11-25 MED ORDER — INSULIN ASPART 100 UNIT/ML ~~LOC~~ SOLN
0.0000 [IU] | Freq: Three times a day (TID) | SUBCUTANEOUS | Status: DC
  Administered 2019-11-26: 3 [IU] via SUBCUTANEOUS
  Administered 2019-11-26 – 2019-11-27 (×3): 2 [IU] via SUBCUTANEOUS
  Administered 2019-11-27 – 2019-11-28 (×2): 3 [IU] via SUBCUTANEOUS
  Administered 2019-11-28: 2 [IU] via SUBCUTANEOUS
  Filled 2019-11-25 (×7): qty 1

## 2019-11-25 MED ORDER — DEXAMETHASONE SODIUM PHOSPHATE 4 MG/ML IJ SOLN
4.0000 mg | Freq: Two times a day (BID) | INTRAMUSCULAR | Status: DC
  Administered 2019-11-26 – 2019-11-29 (×7): 4 mg via INTRAVENOUS
  Filled 2019-11-25 (×10): qty 1

## 2019-11-25 MED ORDER — DEXAMETHASONE SODIUM PHOSPHATE 10 MG/ML IJ SOLN: 10.0000 mg | Freq: Once | INTRAMUSCULAR | Status: DC

## 2019-11-25 NOTE — H&P (Signed)
History and Physical    Bryan Buck [id number] DOB: 1934/03/18 DOA: 11/25/2019  PCP: Kirk Ruths, MD  Patient coming from: Home   Chief Complaint: Altered mental status  HPI: Bryan Buck is a 85 y.o. male with medical history significant of cognitive decline, questionable Alzheimer's disease, COPD, diabetes mellitus type 2, pernicious anemia, hyperlipidemia, type 2 diabetes mellitus, pancytopenia in the past, hypertension was brought in by EMS after brother found him not being in his usual cognitive self.  Note patient is confused and history was given by his brother who is present today.  Per brother who speaks to the patient 2-3 times a week but has not seen him in over 3 years states that patient is usually coherent and he lives by himself.  At times he can have agitation but today when he called the patient patient was incoherent, speech was gurgly and seemed confused.  He he went to patient's house and at that time he felt patient was not oriented and his speech was gurgly and was not coherent so he called EMS. Brother also states patient has history of colon cancer status post resection and chronic vertigo.  He quit smoking several years ago.  ED Course: In the ED he had a CT of the head that revealed vasogenic edema in the left temporoparietal lobe with question of underlying mass measuring 3.6 an MRI was recommended.  Per ER they spoke to Dr. Lacinda Axon neurosurgery about the patient.  There is mild rightward midline shift measuring 45 mm.  Review of Systems: All systems reviewed and otherwise negative.    Past Medical History:  Diagnosis Date  . Cancer ([place])    colon  . Diabetes mellitus without complication ([place])   . Generalized headaches 06/15/2015   pt reported    Past Surgical History:  Procedure Laterality Date  . EYE SURGERY     lens implant right eye  . HERNIA REPAIR    . IRRIGATION AND DEBRIDEMENT KNEE Right 01/16/2017   Procedure: IRRIGATION AND  DEBRIDEMENT KNEE;  Surgeon: Corky Mull, MD;  Location: [facility] ORS;  Service: Orthopedics;  Laterality: Right;  . KNEE ARTHROSCOPY Right 01/16/2017   Procedure: ARTHROSCOPY KNEE;  Surgeon: Corky Mull, MD;  Location: [facility] ORS;  Service: Orthopedics;  Laterality: Right;     reports that he has quit smoking. His smoking use included cigarettes. He has a 40.00 pack-year smoking history. He quit smokeless tobacco use about 26 years ago. He reports current alcohol use of about 6.0 standard drinks of alcohol per week. He reports that he does not use drugs.  No Known Allergies  History reviewed. No pertinent family history.   Prior to Admission medications   Medication Sig Start Date End Date Taking? Authorizing Provider  acetaminophen (TYLENOL) 500 MG tablet Take 500 mg by mouth every 6 (six) hours as needed for mild pain or fever.    Yes [provider]  aspirin EC 81 MG tablet Take 81 mg by mouth daily.    [provider]  metFORMIN (GLUCOPHAGE) 1000 MG tablet Take 1,000 mg by mouth 2 (two) times daily with a meal.    [provider]  pravastatin (PRAVACHOL) 20 MG tablet Take 20 mg by mouth daily.    [provider]    Physical Exam: Vitals:   11/25/19 1442 11/25/19 1447 11/25/19 1448  BP:  (!) 146/64   Pulse:  65   Resp:  17   Temp:  98.6 F (37 C)  TempSrc:  Oral   SpO2: 99% 95%   Weight:   86.2 kg  Height:   6' (1.829 m)    Constitutional: NAD, calm, comfortable Vitals:   11/25/19 1442 11/25/19 1447 11/25/19 1448  BP:  (!) 146/64   Pulse:  65   Resp:  17   Temp:  98.6 F (37 C)   TempSrc:  Oral   SpO2: 99% 95%   Weight:   86.2 kg  Height:   6' (1.829 m)   Eyes: EOMI, conjunctivae normal ENMT: mask in place  Neck: normal, supple,  no thyromegaly Respiratory: clear to auscultation bilaterally, no wheezing, no crackles. Normal respiratory effort. No accessory muscle use.  Cardiovascular: Regular rate and rhythm, no murmurs / rubs /  gallops. No extremity edema. 2+ pedal pulses. Abdomen: no tenderness, no masses palpated.  Bowel sounds positive.  Musculoskeletal: no clubbing / cyanosis. Skin: warm and dry Neurologic: awake and alert, but not oriented except to person. 5/5 strength x4. CN Ii-XII grossly intact. Follows commands appropriately. Speech somewhat clear, but pt is confused Psychiatric: normal mood   Labs on Admission: I have personally reviewed following labs and imaging studies  CBC: Recent Labs  Lab 11/25/19 1513  WBC 5.9  NEUTROABS 3.9  HGB 12.9*  HCT 37.8*  MCV 91.5  PLT [id number]   Basic Metabolic Panel: Recent Labs  Lab 11/25/19 1513  NA 136  K 4.0  CL 99  CO2 24  GLUCOSE 106*  BUN 13  CREATININE 1.14  CALCIUM 9.6   GFR: Estimated Creatinine Clearance: 53.9 mL/min (by C-G formula based on SCr of 1.14 mg/dL). Liver Function Tests: Recent Labs  Lab 11/25/19 1513  AST 18  ALT 13  ALKPHOS 50  BILITOT 1.2  PROT 7.2  ALBUMIN 3.8   No results for input(s): LIPASE, AMYLASE in the last 168 hours. No results for input(s): AMMONIA in the last 168 hours. Coagulation Profile: No results for input(s): INR, PROTIME in the last 168 hours. Cardiac Enzymes: No results for input(s): CKTOTAL, CKMB, CKMBINDEX, TROPONINI in the last 168 hours. BNP (last 3 results) No results for input(s): PROBNP in the last 8760 hours. HbA1C: No results for input(s): HGBA1C in the last 72 hours. CBG: No results for input(s): GLUCAP in the last 168 hours. Lipid Profile: No results for input(s): CHOL, HDL, LDLCALC, TRIG, CHOLHDL, LDLDIRECT in the last 72 hours. Thyroid Function Tests: No results for input(s): TSH, T4TOTAL, FREET4, T3FREE, THYROIDAB in the last 72 hours. Anemia Panel: No results for input(s): VITAMINB12, FOLATE, FERRITIN, TIBC, IRON, RETICCTPCT in the last 72 hours. Urine analysis: No results found for: COLORURINE, APPEARANCEUR, [place], [place], GLUCOSEU, [place], BILIRUBINUR, KETONESUR,  PROTEINUR, UROBILINOGEN, NITRITE, LEUKOCYTESUR  Radiological Exams on Admission: Ct Head Wo Contrast  Result Date: 11/25/2019 CLINICAL DATA:  Altered level of consciousness. EXAM: CT HEAD WITHOUT CONTRAST TECHNIQUE: Contiguous axial images were obtained from the base of the skull through the vertex without intravenous contrast. COMPARISON:  None. FINDINGS: Brain: There is a moderate to large volume of vasogenic edema in the left temporoparietal lobe. There are few associated hyperdense areas within this region. There is a mild rightward midline shift measuring approximately 4-5 mm. There is some mass effect on the temporal horn of the left lateral ventricle. There is suggestion of a subtle underlying mass measuring approximately 3.6 cm. There is mild age related atrophy with chronic microvascular ischemic changes. Vascular: No hyperdense vessel or unexpected calcification. Skull: Normal. Negative for fracture or focal lesion. Sinuses/Orbits: No  acute finding. Other: None. IMPRESSION: 1. There vasogenic edema in the left temporoparietal lobe with suggestion of a subtle underlying mass measuring approximately 3.6 cm. Further evaluation with MRI with and without contrast is recommended. 2. Small hyperdense areas at the periphery of this area of vasogenic edema likely representing a small volume of intra-axial blood products. 3. There is a mild rightward midline shift measuring 4-5 mm. These results were called by telephone at the time of interpretation on 11/25/2019 at 3:46 pm to provider [facility] , who verbally acknowledged these results. Electronically Signed   By: Constance Holster M.D.   On: 11/25/2019 15:48   Dg Chest Portable 1 View  Result Date: 11/25/2019 CLINICAL DATA:  Altered mental status. EXAM: PORTABLE CHEST 1 VIEW COMPARISON:  CT chest 06/16/2008.  Chest x-ray 06/16/2008. FINDINGS: Mediastinum and hilar structures normal. Faint solitary nodular opacities noted over both lung bases most  consistent nipple shadows. Lungs are clear acute infiltrates. No pleural effusion or pneumothorax. Heart size normal. No pulmonary vascular congestion. Degenerative change thoracic spine. IMPRESSION: No acute cardiopulmonary disease. Electronically Signed   By: Franks Field   On: 11/25/2019 15:52    [id number]  Assessment/Plan Active Problems:   Mental status alteration    1.Altered MS-likely secondary tovasogenic edema in the left temporoparietal lobe with question of underlying mass measuring 3.6 Per brother his mental status and coherency has improved since he first saw him No focal neurologic changes except for his incoherence he We will start patient on Decadron 4 mg IV twice daily We will obtain MRI of the brain Neurosurgery Dr. Lacinda Axon has been consulted Neurochecks Seizure precautions covid pending  2.  Dyslipidemia-we will continue his home pravastatin  3.  Has history of mild pancytopenia -Continue to monitor  4.  Diabetes mellitus type 2-blood glucose level here is stable Will check H A1c Check fingersticks Sliding scale Hold home antiglycemic medications   DVT prophylaxis: SCD Code Status: DNR-this was discussed with patient's health proxy which is his brother and per brother patient wanted to be DNR Family Communication: Spoke to brother extensively about plans and lab and image findings Disposition Plan: Home when medically stable Consults called: Neurosurgery Admission status: Inpatient   Nolberto Hanlon MD [facility] Pager [phone]   If 7PM-7AM, please contact night-coverage [email or web address] Password [email or web address]  11/25/2019, 5:25 PM

## 2019-11-25 NOTE — ED Notes (Signed)
Pt resting in bed, denies any needs at this time. Pt states he started coughing after finishing his dinner, non productive cough.

## 2019-11-25 NOTE — ED Notes (Signed)
Pt returned from mri
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
## 2019-11-25 NOTE — Progress Notes (Signed)
Pt refuses tele

## 2019-11-25 NOTE — Progress Notes (Signed)
DNR status verified with brother carl. (over the phone Juel Burrow RN witnessed)

## 2019-11-25 NOTE — ED Notes (Signed)
Pt given dinner tray and water per his request.

## 2019-11-25 NOTE — ED Triage Notes (Signed)
Patient from home lives alone, sent to ed via ems, as per ems brother called him last night and he sounded confused. As per ems patients brother states this change is not normal patient usually alert and oriented x 4. Patient poor historian at present time. Vss denies pain or shortness of breath.

## 2019-11-26 DIAGNOSIS — Z862 Personal history of diseases of the blood and blood-forming organs and certain disorders involving the immune mechanism: Secondary | ICD-10-CM

## 2019-11-26 DIAGNOSIS — E1169 Type 2 diabetes mellitus with other specified complication: Secondary | ICD-10-CM

## 2019-11-26 LAB — COMPREHENSIVE METABOLIC PANEL
ALT: 9 U/L (ref 0–44)
AST: 14 U/L — ABNORMAL LOW (ref 15–41)
Albumin: 3.3 g/dL — ABNORMAL LOW (ref 3.5–5.0)
Alkaline Phosphatase: 48 U/L (ref 38–126)
Anion gap: 9 (ref 5–15)
BUN: 12 mg/dL (ref 8–23)
CO2: 26 mmol/L (ref 22–32)
Calcium: 9.1 mg/dL (ref 8.9–10.3)
Chloride: 102 mmol/L (ref 98–111)
Creatinine, Ser: 1.15 mg/dL (ref 0.61–1.24)
GFR calc Af Amer: >60 mL/min (ref >60)
GFR calc non Af Amer: 59 mL/min — ABNORMAL LOW (ref >60)
Glucose, Bld: 109 mg/dL — ABNORMAL HIGH (ref 70–99)
Potassium: 3.5 mmol/L (ref 3.5–5.1)
Sodium: 137 mmol/L (ref 135–145)
Total Bilirubin: 0.8 mg/dL (ref 0.3–1.2)
Total Protein: 6.3 g/dL — ABNORMAL LOW (ref 6.5–8.1)

## 2019-11-26 LAB — CBC WITH DIFFERENTIAL/PLATELET
Abs Immature Granulocytes: 0.01 10*3/uL (ref 0.00–0.07)
Basophils Absolute: 0 10*3/uL (ref 0.0–0.1)
Basophils Relative: 1 %
Eosinophils Absolute: 0.3 10*3/uL (ref 0.0–0.5)
Eosinophils Relative: 8 %
HCT: 33.7 % — ABNORMAL LOW (ref 39.0–52.0)
Hemoglobin: 11.9 g/dL — ABNORMAL LOW (ref 13.0–17.0)
Immature Granulocytes: 0 %
Lymphocytes Relative: 31 %
Lymphs Abs: 1.4 10*3/uL (ref 0.7–4.0)
MCH: 31.2 pg (ref 26.0–34.0)
MCHC: 35.3 g/dL (ref 30.0–36.0)
MCV: 88.2 fL (ref 80.0–100.0)
Monocytes Absolute: 0.4 10*3/uL (ref 0.1–1.0)
Monocytes Relative: 9 %
Neutro Abs: 2.3 10*3/uL (ref 1.7–7.7)
Neutrophils Relative %: 51 %
Platelets: 150 10*3/uL (ref 150–400)
RBC: 3.82 MIL/uL — ABNORMAL LOW (ref 4.22–5.81)
RDW: 13.4 % (ref 11.5–15.5)
WBC: 4.4 10*3/uL (ref 4.0–10.5)
nRBC: 0 % (ref 0.0–0.2)

## 2019-11-26 LAB — FIBRIN DERIVATIVES D-DIMER (ARMC ONLY): Fibrin derivatives D-dimer (ARMC): 572.22 ng/mL (FEU) — ABNORMAL HIGH (ref 0.00–499.00)

## 2019-11-26 LAB — GLUCOSE, CAPILLARY
Glucose-Capillary: 111 mg/dL — ABNORMAL HIGH (ref 70–99)
Glucose-Capillary: 150 mg/dL — ABNORMAL HIGH (ref 70–99)
Glucose-Capillary: 180 mg/dL — ABNORMAL HIGH (ref 70–99)
Glucose-Capillary: 183 mg/dL — ABNORMAL HIGH (ref 70–99)

## 2019-11-26 LAB — HEMOGLOBIN A1C
Hgb A1c MFr Bld: 5.7 % — ABNORMAL HIGH (ref 4.8–5.6)
Mean Plasma Glucose: 116.89 mg/dL

## 2019-11-26 LAB — ABO/RH: ABO/RH(D): O POS

## 2019-11-26 LAB — FERRITIN: Ferritin: 46 ng/mL (ref 24–336)

## 2019-11-26 LAB — SARS CORONAVIRUS 2 (TAT 6-24 HRS): SARS Coronavirus 2: POSITIVE — AB

## 2019-11-26 LAB — C-REACTIVE PROTEIN: CRP: 0.6 mg/dL (ref <1.0)

## 2019-11-26 LAB — PROCALCITONIN: Procalcitonin: <0.1 ng/mL

## 2019-11-26 MED ORDER — LORAZEPAM 2 MG/ML IJ SOLN
0.5000 mg | INTRAMUSCULAR | Status: DC
  Administered 2019-11-26 – 2019-11-28 (×4): 0.5 mg via INTRAVENOUS
  Filled 2019-11-26 (×5): qty 1

## 2019-11-26 MED ORDER — VITAMIN C 500 MG PO TABS
500.0000 mg | ORAL_TABLET | Freq: Every day | ORAL | Status: DC
  Administered 2019-11-26 – 2019-11-28 (×3): 500 mg via ORAL
  Filled 2019-11-26 (×3): qty 1

## 2019-11-26 MED ORDER — ZINC SULFATE 220 (50 ZN) MG PO CAPS
220.0000 mg | ORAL_CAPSULE | Freq: Every day | ORAL | Status: DC
  Administered 2019-11-26 – 2019-11-28 (×3): 220 mg via ORAL
  Filled 2019-11-26 (×3): qty 1

## 2019-11-26 NOTE — Progress Notes (Signed)
CRITICAL VALUE ALERT  Critical Value: Positive COVID test   Date & Time Notied: [id number]  Provider Notified: Stark Klein, NP    Orders Received/Actions taken: Orders placed

## 2019-11-26 NOTE — TOC Progression Note (Addendum)
Transition of Care [facility]) - Progression Note    Patient Details  Name: Bryan Buck MRN: [id number] Date of Birth: June 02, 1934  Transition of Care [facility]) CM/SW Contact  Cecil Cobbs Phone Number: 11/26/2019, 6:24 PM  Clinical Narrative:    CSW attempted to contact patient's brother to discuss home health or SNF, unable to make contact, will tray at a later time.  Patient has some confusion, CSW attempted to speak with patient's brother.        Expected Discharge Plan and Services                                                 Social Determinants of Health (SDOH) Interventions    Readmission Risk Interventions No flowsheet data found.

## 2019-11-26 NOTE — Progress Notes (Signed)
Report called to takera Rn

## 2019-11-26 NOTE — Progress Notes (Addendum)
PROGRESS NOTE    Bryan Buck  [id number] DOB: 1934-11-23 DOA: 11/25/2019 PCP: Kirk Ruths, MD    Brief Narrative:  Bryan Buck is a 85 y.o. male with medical history significant of cognitive decline, questionable Alzheimer's disease, COPD, diabetes mellitus type 2, pernicious anemia, hyperlipidemia, type 2 diabetes mellitus, pancytopenia in the past, hypertension was brought in by EMS after brother found him not being in his usual cognitive self and had gurgling of speech. CT of the head revealed vasogenic edema in the left temporoparietal lobe with question of underlying mass measuring 3.6.  Neurosurgery was consulted.  Patient also found with COVID-19 positive.      Consultants:   Neurosurgery  Procedures: CT of the head and MRI  Antimicrobials:   None   Subjective: Pt is trying to walk out of there is room several times today.  He is still confused.  NAD. Denies sob, cp, or any other sx.  Objective: Vitals:   11/26/19 0354 11/26/19 0358 11/26/19 0400 11/26/19 0854  BP:   (!) 157/65 (!) 144/68  Pulse:  62 62 63  Resp:   18 18  Temp:   98.3 F (36.8 C) 98.1 F (36.7 C)  TempSrc:   Oral Oral  SpO2:  100% 100% 97%  Weight: 77 kg     Height: 6' (1.829 m)       Intake/Output Summary (Last 24 hours) at 11/26/2019 1300 Last data filed at 11/26/2019 [id number] Gross per 24 hour  Intake --  Output 200 ml  Net -200 ml   Filed Weights   11/25/19 1448 11/26/19 0354  Weight: 86.2 kg 77 kg    Examination:  General exam: Appears calm and comfortable , but confused Respiratory system: Clear to auscultation. Respiratory effort normal. Cardiovascular system: S1 & S2 heard, RRR. No JVD, murmurs, rubs, gallops or clicks. No pedal edema. Gastrointestinal system: Abdomen is nondistended, soft and nontender. Normal bowel sounds heard. Central nervous system: awake, confused, no focal neurological deficits. Extremities: no edema Skin: Warm and dry Psychiatry:  Mood & affect appropriate in current setting.     Data Reviewed: I have personally reviewed following labs and imaging studies  CBC: Recent Labs  Lab 11/25/19 1513 11/26/19 0710  WBC 5.9 4.4  NEUTROABS 3.9 2.3  HGB 12.9* 11.9*  HCT 37.8* 33.7*  MCV 91.5 88.2  PLT 166 [id number]   Basic Metabolic Panel: Recent Labs  Lab 11/25/19 1513 11/26/19 0710  NA 136 137  K 4.0 3.5  CL 99 102  CO2 24 26  GLUCOSE 106* 109*  BUN 13 12  CREATININE 1.14 1.15  CALCIUM 9.6 9.1   GFR: Estimated Creatinine Clearance: 53 mL/min (by C-G formula based on SCr of 1.15 mg/dL). Liver Function Tests: Recent Labs  Lab 11/25/19 1513 11/26/19 0710  AST 18 14*  ALT 13 9  ALKPHOS 50 48  BILITOT 1.2 0.8  PROT 7.2 6.3*  ALBUMIN 3.8 3.3*   No results for input(s): LIPASE, AMYLASE in the last 168 hours. No results for input(s): AMMONIA in the last 168 hours. Coagulation Profile: No results for input(s): INR, PROTIME in the last 168 hours. Cardiac Enzymes: No results for input(s): CKTOTAL, CKMB, CKMBINDEX, TROPONINI in the last 168 hours. BNP (last 3 results) No results for input(s): PROBNP in the last 8760 hours. HbA1C: Recent Labs    11/26/19 0441  HGBA1C 5.7*   CBG: Recent Labs  Lab 11/25/19 2308 11/26/19 0852 11/26/19 1144  GLUCAP 159* 111* 150*   Lipid  Profile: No results for input(s): CHOL, HDL, LDLCALC, TRIG, CHOLHDL, LDLDIRECT in the last 72 hours. Thyroid Function Tests: No results for input(s): TSH, T4TOTAL, FREET4, T3FREE, THYROIDAB in the last 72 hours. Anemia Panel: Recent Labs    11/26/19 0710  FERRITIN 61   Sepsis Labs: Recent Labs  Lab 11/26/19 0710  PROCALCITON <0.10    Recent Results (from the past 240 hour(s))  SARS CORONAVIRUS 2 (TAT 6-24 HRS) Nasopharyngeal Nasopharyngeal Swab     Status: Abnormal   Collection Time: 11/25/19  3:46 PM   Specimen: Nasopharyngeal Swab  Result Value Ref Range Status   SARS Coronavirus 2 POSITIVE (A) NEGATIVE Final     Comment: RESULT CALLED TO, READ BACK BY AND VERIFIED WITH: Juliane Lack, RN 0518 11/26/19 G.MCADOO (NOTE) SARS-CoV-2 target nucleic acids are DETECTED. The SARS-CoV-2 RNA is generally detectable in upper and lower respiratory specimens during the acute phase of infection. Positive results are indicative of the presence of SARS-CoV-2 RNA. Clinical correlation with patient history and other diagnostic information is  necessary to determine patient infection status. Positive results do not rule out bacterial infection or co-infection with other viruses.  The expected result is Negative. Fact Sheet for Patients: SugarRoll.be Fact Sheet for Healthcare Providers: https://www.woods-mathews.com/ This test is not yet approved or cleared by the [place] FDA and  has been authorized for detection and/or diagnosis of SARS-CoV-2 by FDA under an Emergency Use Authorization (EUA). This EUA will remain  in effect (meaning this test can be used) for th e duration of the COVID-19 declaration under Section 564(b)(1) of the Act, 21 U.S.C. section 360bbb-3(b)(1), unless the authorization is terminated or revoked sooner. Performed at [facility], [place] [address], [place], [place] [address]          Radiology Studies: Ct Head Wo Contrast  Result Date: 11/25/2019 CLINICAL DATA:  Altered level of consciousness. EXAM: CT HEAD WITHOUT CONTRAST TECHNIQUE: Contiguous axial images were obtained from the base of the skull through the vertex without intravenous contrast. COMPARISON:  None. FINDINGS: Brain: There is a moderate to large volume of vasogenic edema in the left temporoparietal lobe. There are few associated hyperdense areas within this region. There is a mild rightward midline shift measuring approximately 4-5 mm. There is some mass effect on the temporal horn of the left lateral ventricle. There is suggestion of a subtle underlying mass measuring  approximately 3.6 cm. There is mild age related atrophy with chronic microvascular ischemic changes. Vascular: No hyperdense vessel or unexpected calcification. Skull: Normal. Negative for fracture or focal lesion. Sinuses/Orbits: No acute finding. Other: None. IMPRESSION: 1. There vasogenic edema in the left temporoparietal lobe with suggestion of a subtle underlying mass measuring approximately 3.6 cm. Further evaluation with MRI with and without contrast is recommended. 2. Small hyperdense areas at the periphery of this area of vasogenic edema likely representing a small volume of intra-axial blood products. 3. There is a mild rightward midline shift measuring 4-5 mm. These results were called by telephone at the time of interpretation on 11/25/2019 at 3:46 pm to provider [facility] , who verbally acknowledged these results. Electronically Signed   By: Constance Holster M.D.   On: 11/25/2019 15:48   Ct Chest W Contrast  Result Date: 11/26/2019 CLINICAL DATA:  Brain lesions on recent head CT and brain MRI suspicious for metastatic disease. Evaluate for possible primary tumor. EXAM: CT CHEST, ABDOMEN, AND PELVIS WITH CONTRAST TECHNIQUE: Multidetector CT imaging of the chest, abdomen and pelvis was  performed following the standard protocol during bolus administration of intravenous contrast. CONTRAST:  118mL OMNIPAQUE IOHEXOL 300 MG/ML  SOLN COMPARISON:  None. FINDINGS: CT CHEST FINDINGS Cardiovascular: The heart is normal in size. No pericardial effusion. There is mild tortuosity and calcification of the thoracic aorta but no focal aneurysm or dissection. Scattered three-vessel coronary artery calcifications are noted. Mediastinum/Nodes: No mediastinal or hilar mass or adenopathy. The esophagus is grossly normal. Lungs/Pleura: Emphysematous changes and pulmonary scarring. No acute pulmonary findings. No worrisome pulmonary lesions. No pulmonary nodules to suggest metastatic disease. No pleural effusions or  pleural lesions. Musculoskeletal: No chest wall mass, supraclavicular or axillary lymphadenopathy. The bony thorax is intact. No worrisome bone lesions to suggest metastatic disease. CT ABDOMEN PELVIS FINDINGS Hepatobiliary: Scattered benign-appearing hepatic cysts. No worrisome hepatic lesions or intrahepatic biliary dilatation. Rim calcified gallstones are noted in the gallbladder. No common bile duct dilatation. Pancreas: No mass, inflammation or ductal dilatation. Spleen: Normal size.  No focal lesions. Adrenals/Urinary Tract: The adrenal glands and kidneys are unremarkable. No worrisome renal lesions or hydronephrosis. Thick-walled bladder likely due to partial bladder outlet obstruction. No bladder mass is identified. Stomach/Bowel: The stomach, duodenum, small bowel and colon are grossly normal without oral contrast. No acute inflammatory changes, mass lesions or obstructive findings. There are surgical changes in the right upper quadrant with partial right hemicolectomy. No masses identified. Vascular/Lymphatic: Advanced atherosclerotic calcifications involving the aorta and branch vessel ostia and iliac arteries but no aneurysm or dissection. The major venous structures are patent. Small scattered mesenteric and retroperitoneal lymph nodes but no mass or adenopathy. Reproductive: Enlarged cystic appearing seminal vesicles bilaterally with rim calcifications on the left. Prostate gland is mildly enlarged. Other: No pelvic mass or pelvic lymphadenopathy. No inguinal mass or adenopathy. Small bilateral inguinal hernias containing fat. Musculoskeletal: No significant bony findings. IMPRESSION: 1. No CT findings for primary neoplasm or metastatic disease in the chest, abdomen or pelvis. 2. Emphysematous changes but no acute pulmonary findings or worrisome pulmonary lesions. 3. Simple appearing hepatic cysts. 4. Evidence of prior partial right hemicolectomy. 5. Cholelithiasis. 6. No findings for osseous  metastatic disease. Electronically Signed   By: Marijo Sanes M.D.   On: 11/26/2019 08:26   Mr Jeri Cos And Wo Contrast  Result Date: 11/25/2019 CLINICAL DATA:  85 year old male with altered mental status and abnormal noncontrast head CT earlier today demonstrating left hemisphere edema and mass effect. EXAM: MRI HEAD WITHOUT AND WITH CONTRAST TECHNIQUE: Multiplanar, multiecho pulse sequences of the brain and surrounding structures were obtained without and with intravenous contrast. CONTRAST:  70mL GADAVIST GADOBUTROL 1 MMOL/ML IV SOLN COMPARISON:  Head CT 1532 hours today.  Brain MRI 10/18/2014. FINDINGS: Brain: There are multiple enhancing masses affecting the left hemisphere. The largest lesion is a rim enhancing cystic, partially hemorrhagic mass centered in the posterior left temporal lobe measuring 41 millimeters diameter (series 15, image 13 and series 16, image 16). Anterior to the dominant mass is an adjacent solidly enhancing round 18 millimeter lesion (series 15, image 12) with mildly decreased T2 signal (series 10, image 12) and mildly restricted diffusion. In the nearby left deep gray matter nuclei cauda thalamic groove there is a similar round more solidly enhancing 14 millimeter mass (series 15, image 12). Furthermore there is another somewhat T2 hypointense oval enhancing mass occupying the left hippocampus measuring 18 millimeters (series 16, image 16 and series 12, image 16). That lesion is also somewhat restricted on diffusion. And finally, in the left middle cranial fossa there  is an intra-axial versus extra-axial round 30 millimeter diameter solidly enhancing mass on series 15, image 7. That lesion seems to have adjacent dural thickening (same image). But no other dural thickening is identified throughout the brain. And on sagittal T1 images (series 13, image 16) the lesion appears more intra-axial. All of these are new since the 2015 MRI. There is subsequent abundant T2 and FLAIR  hyperintensity resembling vasogenic edema, present throughout the left temporal lobe, tracking into the posterior deep white matter capsules and toward the superior left perirolandic cortex. Associated mass effect on the left lateral ventricle and 5 millimeters of rightward midline shift. Basilar cisterns remain patent. Conspicuous absence of lesions in the right hemisphere. No brainstem or posterior fossa involvement. Negative visible cervical spinal cord. No superimposed restricted diffusion suggestive of acute infarction. No ventriculomegaly, ependymal enhancement, or acute intracranial hemorrhage. Cervicomedullary junction and pituitary are within normal limits. Progressed mild white matter signal changes in the right hemisphere suggesting chronic small vessel disease. No cerebral blood products identified outside of the dominant enhancing mass. Vascular: Major intracranial vascular flow voids are stable since 2015. Dominant and tortuous right vertebral artery. The major dural venous sinuses seem to be patent. Skull and upper cervical spine: Visualized bone marrow signal is within normal limits. Sinuses/Orbits: Stable and negative orbits. Paranasal sinus mucosal thickening has regressed since 2015. Other: Mild chronic left mastoid effusion has not significantly changed. Visible internal auditory structures appear normal. Scalp and face soft tissues appear negative. IMPRESSION: 1. There is a heterogeneous population of Five enhancing brain masses limited to the left hemisphere. All are new from a 2015 brain MRI. They range from solid and 14 mm diameter to cystic/partially hemorrhagic measuring 41 mm diameter. Diffusion and T2 signal varies between the masses. And one of the lesions (left middle cranial fossa, 30 mm) might be extra-axial and seems to have a small area of associated dural thickening. 2. Infiltrating, multifocal high-grade Glioma or Glioblastoma is a possibility. CNS Lymphoma (particularly large  B-cell type) is also a consideration. Metastatic disease is possible but multiple lesions this size limited to the left hemisphere would be highly unusual. Superimposed meningioma was considered for the left middle cranial fossa lesion, but not favored. Infectious or inflammatory etiology is felt unlikely, and the dominant cystic lesion does NOT have MRI characteristics of abscess. 3. Associated edema versus nonenhancing tumor in the left hemisphere with mass effect on the left lateral ventricle and rightward midline shift of 5 mm. Basilar cisterns remain patent and there is no ventriculomegaly. Electronically Signed   By: Genevie Ann M.D.   On: 11/25/2019 19:32   Ct Abdomen Pelvis W Contrast  Result Date: 11/26/2019 CLINICAL DATA:  Brain lesions on recent head CT and brain MRI suspicious for metastatic disease. Evaluate for possible primary tumor. EXAM: CT CHEST, ABDOMEN, AND PELVIS WITH CONTRAST TECHNIQUE: Multidetector CT imaging of the chest, abdomen and pelvis was performed following the standard protocol during bolus administration of intravenous contrast. CONTRAST:  179mL OMNIPAQUE IOHEXOL 300 MG/ML  SOLN COMPARISON:  None. FINDINGS: CT CHEST FINDINGS Cardiovascular: The heart is normal in size. No pericardial effusion. There is mild tortuosity and calcification of the thoracic aorta but no focal aneurysm or dissection. Scattered three-vessel coronary artery calcifications are noted. Mediastinum/Nodes: No mediastinal or hilar mass or adenopathy. The esophagus is grossly normal. Lungs/Pleura: Emphysematous changes and pulmonary scarring. No acute pulmonary findings. No worrisome pulmonary lesions. No pulmonary nodules to suggest metastatic disease. No pleural effusions or pleural lesions. Musculoskeletal:  No chest wall mass, supraclavicular or axillary lymphadenopathy. The bony thorax is intact. No worrisome bone lesions to suggest metastatic disease. CT ABDOMEN PELVIS FINDINGS Hepatobiliary: Scattered  benign-appearing hepatic cysts. No worrisome hepatic lesions or intrahepatic biliary dilatation. Rim calcified gallstones are noted in the gallbladder. No common bile duct dilatation. Pancreas: No mass, inflammation or ductal dilatation. Spleen: Normal size.  No focal lesions. Adrenals/Urinary Tract: The adrenal glands and kidneys are unremarkable. No worrisome renal lesions or hydronephrosis. Thick-walled bladder likely due to partial bladder outlet obstruction. No bladder mass is identified. Stomach/Bowel: The stomach, duodenum, small bowel and colon are grossly normal without oral contrast. No acute inflammatory changes, mass lesions or obstructive findings. There are surgical changes in the right upper quadrant with partial right hemicolectomy. No masses identified. Vascular/Lymphatic: Advanced atherosclerotic calcifications involving the aorta and branch vessel ostia and iliac arteries but no aneurysm or dissection. The major venous structures are patent. Small scattered mesenteric and retroperitoneal lymph nodes but no mass or adenopathy. Reproductive: Enlarged cystic appearing seminal vesicles bilaterally with rim calcifications on the left. Prostate gland is mildly enlarged. Other: No pelvic mass or pelvic lymphadenopathy. No inguinal mass or adenopathy. Small bilateral inguinal hernias containing fat. Musculoskeletal: No significant bony findings. IMPRESSION: 1. No CT findings for primary neoplasm or metastatic disease in the chest, abdomen or pelvis. 2. Emphysematous changes but no acute pulmonary findings or worrisome pulmonary lesions. 3. Simple appearing hepatic cysts. 4. Evidence of prior partial right hemicolectomy. 5. Cholelithiasis. 6. No findings for osseous metastatic disease. Electronically Signed   By: Marijo Sanes M.D.   On: 11/26/2019 08:26   Dg Chest Portable 1 View  Result Date: 11/25/2019 CLINICAL DATA:  Altered mental status. EXAM: PORTABLE CHEST 1 VIEW COMPARISON:  CT chest  06/16/2008.  Chest x-ray 06/16/2008. FINDINGS: Mediastinum and hilar structures normal. Faint solitary nodular opacities noted over both lung bases most consistent nipple shadows. Lungs are clear acute infiltrates. No pleural effusion or pneumothorax. Heart size normal. No pulmonary vascular congestion. Degenerative change thoracic spine. IMPRESSION: No acute cardiopulmonary disease. Electronically Signed   By: Marcello Moores  Register   On: 11/25/2019 15:52        Scheduled Meds:  dexamethasone (DECADRON) injection  4 mg Intravenous Q12H   insulin aspart  0-15 Units Subcutaneous TID WC   LORazepam  0.5 mg Intravenous Q4H   pravastatin  20 mg Oral Daily   vitamin C  500 mg Oral Daily   zinc sulfate  220 mg Oral Daily   Continuous Infusions:  Assessment & Plan:   Active Problems:   Mental status alteration  1.Altered MS-likely secondary tovasogenic edema in the left temporoparietal lobe with question of underlying mass measuring 3.6 MRI with Five enhancing brain, could be CNS lymphoma or even multi focal high-grade glioma or glioblastoma please see full MRI results masses limited to the left hemisphere. Continue on Decadron 4 mg IV twice daily for now , may need to transition to po steroid upon d/c (2mg  bid) Neurosurgery's input was appreciated recommended no urgent intervention, after discussion with the family we will hold off on any biopsy Neurochecks Seizure precautions Will consult palliative care, as likely pt will need hospice care.  2.  Dyslipidemia- Continue his home pravastatin  3.   history of mild pancytopenia -Continue to monitor  4.  Diabetes mellitus type 2- blood glucose level here is stable Will check H A1c Check fingersticks Sliding scale Hold home antiglycemic medications   DVT prophylaxis: SCD Code Status: DNR- Family  Communication: none at bedside Disposition Plan: likely be here 1-2 more days, until medically stable. Palliative care consulted  pending. Will need hospice too      LOS: 1 day   Time spent: 45 minutes with more than 50% on [place], MD [facility] Pager [phone]  If 7PM-7AM, please contact night-coverage [email or web address] Password [email or web address] 11/26/2019, 1:00 PM

## 2019-11-26 NOTE — Consult Note (Signed)
Neurosurgery-New Consultation Evaluation 11/26/2019 Bryan Buck OA:[id number]  Identifying Statement: Bryan Buck is a 85 y.o. male from [place] [address] with newly diagnosed brain lesions  Physician Requesting Consultation: Nolberto Hanlon, MD  History of Present Illness: Bryan Buck is admitted to the hospital with new findings of speech difficulty, worsening cognition.  He reportedly lives by himself and spoke with his brother on a weekly basis but his brother did notice some changes during their last call.  In the emergency department, he was noted to have some generalized fatigue and speech difficulty.  CT of the head was obtained which revealed likely edema in the left hemisphere.  MRI with contrast of the brain was also obtained which did reveal multiple lesions in the left frontal and temporal lobes.  On admission, his COVID-19 test was also found to be positive.  Past Medical History:  Past Medical History:  Diagnosis Date  . Cancer ([place])    colon  . Diabetes mellitus without complication ([place])   . Generalized headaches 06/15/2015   pt reported    Social History: Social History   Socioeconomic History  . Marital status: Divorced    Spouse name: Not on file  . Number of children: Not on file  . Years of education: Not on file  . Highest education level: Not on file  Occupational History  . Not on file  Social Needs  . Financial resource strain: Not on file  . Food insecurity    Worry: Not on file    Inability: Not on file  . Transportation needs    Medical: Not on file    Non-medical: Not on file  Tobacco Use  . Smoking status: Former Smoker    Packs/day: 2.00    Years: 20.00    Pack years: 40.00    Types: Cigarettes  . Smokeless tobacco: Former Systems developer    Quit date: 06/14/1993  Substance and Sexual Activity  . Alcohol use: Yes    Alcohol/week: 6.0 standard drinks    Types: 6 Cans of beer per week  . Drug use: No  . Sexual activity: Not on file  Lifestyle   . Physical activity    Days per week: Not on file    Minutes per session: Not on file  . Stress: Not on file  Other Topics Concern  . Not on file  Social History Narrative  . Not on file   Living arrangements (living alone, with partner): Lives alone, only living family member is his older brother who is helping make decisions  Family History: History reviewed. No pertinent family history.  Review of Systems:  Review of Systems - General ROS: Positive for cognition changes Psychological ROS: Negative Ophthalmic ROS: Negative ENT ROS: Negative Hematological and Lymphatic ROS: Negative  Endocrine ROS: Negative Respiratory ROS: Negative Cardiovascular ROS: Negative Gastrointestinal ROS: Negative Genito-Urinary ROS: Negative Musculoskeletal ROS: Negative Neurological ROS: Positive for speech changes Dermatological ROS: Negative  Physical Exam: BP (!) 157/65 (BP Location: Right Arm)   Pulse 62   Temp 98.3 F (36.8 C) (Oral)   Resp 18   Ht 6' (1.829 m)   Wt 77 kg   SpO2 100%   BMI 23.03 kg/m  Body mass index is 23.03 kg/m. Body surface area is 1.98 meters squared.  Exam was unable to performed at this time and deferred given his COVID-19 status  Laboratory: Results for orders placed or performed during the hospital encounter of 11/25/19  SARS CORONAVIRUS 2 (TAT 6-24 HRS) Nasopharyngeal  Nasopharyngeal Swab   Specimen: Nasopharyngeal Swab  Result Value Ref Range   SARS Coronavirus 2 POSITIVE (A) NEGATIVE  Urinalysis, Complete w Microscopic  Result Value Ref Range   Color, Urine YELLOW (A) YELLOW   APPearance CLOUDY (A) CLEAR   Specific Gravity, Urine >1.046 (H) 1.005 - 1.030   pH 5.0 5.0 - 8.0   Glucose, UA NEGATIVE NEGATIVE mg/dL   Hgb urine dipstick SMALL (A) NEGATIVE   Bilirubin Urine NEGATIVE NEGATIVE   Ketones, ur 5 (A) NEGATIVE mg/dL   Protein, ur NEGATIVE NEGATIVE mg/dL   Nitrite POSITIVE (A) NEGATIVE   Leukocytes,Ua LARGE (A) NEGATIVE   RBC / HPF  21-50 0 - 5 RBC/hpf   WBC, UA >50 (H) 0 - 5 WBC/hpf   Bacteria, UA RARE (A) NONE SEEN   Squamous Epithelial / LPF NONE SEEN 0 - 5   Mucus PRESENT   Comprehensive metabolic panel  Result Value Ref Range   Sodium 136 135 - 145 mmol/L   Potassium 4.0 3.5 - 5.1 mmol/L   Chloride 99 98 - 111 mmol/L   CO2 24 22 - 32 mmol/L   Glucose, Bld 106 (H) 70 - 99 mg/dL   BUN 13 8 - 23 mg/dL   Creatinine, Ser 1.14 0.61 - 1.24 mg/dL   Calcium 9.6 8.9 - 10.3 mg/dL   Total Protein 7.2 6.5 - 8.1 g/dL   Albumin 3.8 3.5 - 5.0 g/dL   AST 18 15 - 41 U/L   ALT 13 0 - 44 U/L   Alkaline Phosphatase 50 38 - 126 U/L   Total Bilirubin 1.2 0.3 - 1.2 mg/dL   GFR calc non Af Amer 59 (L) >60 mL/min   GFR calc Af Amer >60 >60 mL/min   Anion gap 13 5 - 15  CBC with Differential  Result Value Ref Range   WBC 5.9 4.0 - 10.5 K/uL   RBC 4.13 (L) 4.22 - 5.81 MIL/uL   Hemoglobin 12.9 (L) 13.0 - 17.0 g/dL   HCT 37.8 (L) 39.0 - 52.0 %   MCV 91.5 80.0 - 100.0 fL   MCH 31.2 26.0 - 34.0 pg   MCHC 34.1 30.0 - 36.0 g/dL   RDW 13.4 11.5 - 15.5 %   Platelets 166 150 - 400 K/uL   nRBC 0.0 0.0 - 0.2 %   Neutrophils Relative % 66 %   Neutro Abs 3.9 1.7 - 7.7 K/uL   Lymphocytes Relative 21 %   Lymphs Abs 1.3 0.7 - 4.0 K/uL   Monocytes Relative 8 %   Monocytes Absolute 0.5 0.1 - 1.0 K/uL   Eosinophils Relative 4 %   Eosinophils Absolute 0.2 0.0 - 0.5 K/uL   Basophils Relative 1 %   Basophils Absolute 0.0 0.0 - 0.1 K/uL   Immature Granulocytes 0 %   Abs Immature Granulocytes 0.01 0.00 - 0.07 K/uL  Urine Drug Screen, Qualitative  Result Value Ref Range   Tricyclic, Ur Screen NONE DETECTED NONE DETECTED   Amphetamines, Ur Screen NONE DETECTED NONE DETECTED   MDMA (Ecstasy)Ur Screen NONE DETECTED NONE DETECTED   Cocaine Metabolite,Ur [place] NONE DETECTED NONE DETECTED   Opiate, Ur Screen NONE DETECTED NONE DETECTED   Phencyclidine (PCP) Ur S NONE DETECTED NONE DETECTED   Cannabinoid 50 Ng, Ur [place] NONE DETECTED NONE DETECTED    Barbiturates, Ur Screen NONE DETECTED NONE DETECTED   Benzodiazepine, Ur Scrn NONE DETECTED NONE DETECTED   Methadone Scn, Ur NONE DETECTED NONE DETECTED  Ethanol  Result  Value Ref Range   Alcohol, Ethyl (B) <10 <10 mg/dL  Glucose, capillary  Result Value Ref Range   Glucose-Capillary 159 (H) 70 - 99 mg/dL  Fibrin derivatives D-Dimer ([facility] only)  Result Value Ref Range   Fibrin derivatives D-dimer ([facility]) 572.22 (H) 0.00 - 499.00 ng/mL (FEU)  Comprehensive metabolic panel  Result Value Ref Range   Sodium 137 135 - 145 mmol/L   Potassium 3.5 3.5 - 5.1 mmol/L   Chloride 102 98 - 111 mmol/L   CO2 26 22 - 32 mmol/L   Glucose, Bld 109 (H) 70 - 99 mg/dL   BUN 12 8 - 23 mg/dL   Creatinine, Ser 1.15 0.61 - 1.24 mg/dL   Calcium 9.1 8.9 - 10.3 mg/dL   Total Protein 6.3 (L) 6.5 - 8.1 g/dL   Albumin 3.3 (L) 3.5 - 5.0 g/dL   AST 14 (L) 15 - 41 U/L   ALT 9 0 - 44 U/L   Alkaline Phosphatase 48 38 - 126 U/L   Total Bilirubin 0.8 0.3 - 1.2 mg/dL   GFR calc non Af Amer 59 (L) >60 mL/min   GFR calc Af Amer >60 >60 mL/min   Anion gap 9 5 - 15  CBC with Differential/Platelet  Result Value Ref Range   WBC 4.4 4.0 - 10.5 K/uL   RBC 3.82 (L) 4.22 - 5.81 MIL/uL   Hemoglobin 11.9 (L) 13.0 - 17.0 g/dL   HCT 33.7 (L) 39.0 - 52.0 %   MCV 88.2 80.0 - 100.0 fL   MCH 31.2 26.0 - 34.0 pg   MCHC 35.3 30.0 - 36.0 g/dL   RDW 13.4 11.5 - 15.5 %   Platelets 150 150 - 400 K/uL   nRBC 0.0 0.0 - 0.2 %   Neutrophils Relative % 51 %   Neutro Abs 2.3 1.7 - 7.7 K/uL   Lymphocytes Relative 31 %   Lymphs Abs 1.4 0.7 - 4.0 K/uL   Monocytes Relative 9 %   Monocytes Absolute 0.4 0.1 - 1.0 K/uL   Eosinophils Relative 8 %   Eosinophils Absolute 0.3 0.0 - 0.5 K/uL   Basophils Relative 1 %   Basophils Absolute 0.0 0.0 - 0.1 K/uL   Immature Granulocytes 0 %   Abs Immature Granulocytes 0.01 0.00 - 0.07 K/uL  Ferritin  Result Value Ref Range   Ferritin 46 24 - 336 ng/mL  ABO/Rh  Result Value Ref Range    ABO/RH(D)      O POS Performed at [facility], [address] [place], [place] [address]   Troponin I (High Sensitivity)  Result Value Ref Range   Troponin I (High Sensitivity) 6 <18 ng/L  Troponin I (High Sensitivity)  Result Value Ref Range   Troponin I (High Sensitivity) 5 <18 ng/L   I personally reviewed labs  Imaging: Results for orders placed during the hospital encounter of 11/25/19  MR Brain W and Wo Contrast   Narrative CLINICAL DATA:  85 year old male with altered mental status and abnormal noncontrast head CT earlier today demonstrating left hemisphere edema and mass effect.  EXAM: MRI HEAD WITHOUT AND WITH CONTRAST  TECHNIQUE: Multiplanar, multiecho pulse sequences of the brain and surrounding structures were obtained without and with intravenous contrast.  CONTRAST:  42mL GADAVIST GADOBUTROL 1 MMOL/ML IV SOLN  COMPARISON:  Head CT 1532 hours today.  Brain MRI 10/18/2014.  FINDINGS: Brain: There are multiple enhancing masses affecting the left hemisphere.  The largest lesion is a rim enhancing cystic, partially  hemorrhagic mass centered in the posterior left temporal lobe measuring 41 millimeters diameter (series 15, image 13 and series 16, image 16). Anterior to the dominant mass is an adjacent solidly enhancing round 18 millimeter lesion (series 15, image 12) with mildly decreased T2 signal (series 10, image 12) and mildly restricted diffusion. In the nearby left deep gray matter nuclei cauda thalamic groove there is a similar round more solidly enhancing 14 millimeter mass (series 15, image 12).  Furthermore there is another somewhat T2 hypointense oval enhancing mass occupying the left hippocampus measuring 18 millimeters (series 16, image 16 and series 12, image 16). That lesion is also somewhat restricted on diffusion.  And finally, in the left middle cranial fossa there is an intra-axial versus extra-axial round 30 millimeter diameter  solidly enhancing mass on series 15, image 7. That lesion seems to have adjacent dural thickening (same image). But no other dural thickening is identified throughout the brain. And on sagittal T1 images (series 13, image 16) the lesion appears more intra-axial.  All of these are new since the 2015 MRI. There is subsequent abundant T2 and FLAIR hyperintensity resembling vasogenic edema, present throughout the left temporal lobe, tracking into the posterior deep white matter capsules and toward the superior left perirolandic cortex. Associated mass effect on the left lateral ventricle and 5 millimeters of rightward midline shift. Basilar cisterns remain patent.  Conspicuous absence of lesions in the right hemisphere. No brainstem or posterior fossa involvement. Negative visible cervical spinal cord.  No superimposed restricted diffusion suggestive of acute infarction. No ventriculomegaly, ependymal enhancement, or acute intracranial hemorrhage. Cervicomedullary junction and pituitary are within normal limits.  Progressed mild white matter signal changes in the right hemisphere suggesting chronic small vessel disease. No cerebral blood products identified outside of the dominant enhancing mass.  Vascular: Major intracranial vascular flow voids are stable since 2015. Dominant and tortuous right vertebral artery. The major dural venous sinuses seem to be patent.  Skull and upper cervical spine: Visualized bone marrow signal is within normal limits.  Sinuses/Orbits: Stable and negative orbits. Paranasal sinus mucosal thickening has regressed since 2015.  Other: Mild chronic left mastoid effusion has not significantly changed. Visible internal auditory structures appear normal. Scalp and face soft tissues appear negative.  IMPRESSION: 1. There is a heterogeneous population of Five enhancing brain masses limited to the left hemisphere. All are new from a 2015 brain MRI. They  range from solid and 14 mm diameter to cystic/partially hemorrhagic measuring 41 mm diameter. Diffusion and T2 signal varies between the masses. And one of the lesions (left middle cranial fossa, 30 mm) might be extra-axial and seems to have a small area of associated dural thickening. 2. Infiltrating, multifocal high-grade Glioma or Glioblastoma is a possibility. CNS Lymphoma (particularly large B-cell type) is also a consideration. Metastatic disease is possible but multiple lesions this size limited to the left hemisphere would be highly unusual. Superimposed meningioma was considered for the left middle cranial fossa lesion, but not favored. Infectious or inflammatory etiology is felt unlikely, and the dominant cystic lesion does NOT have MRI characteristics of abscess. 3. Associated edema versus nonenhancing tumor in the left hemisphere with mass effect on the left lateral ventricle and rightward midline shift of 5 mm. Basilar cisterns remain patent and there is no ventriculomegaly.   Electronically Signed   By: Genevie Ann M.D.   On: 11/25/2019 19:32    I personally reviewed radiology studies    Impression/Plan:  Bryan Buck is admitted  with what appears to be most likely multifocal primary brain tumor.  He did have body imaging performed that did not reveal any obvious lesions but we are awaiting final interpretation from the radiologist.  Given the inability for the patient to make rational decisions at this point, his brother has been involved for care.  He has made him a DNR status.  I did speak with him about the findings on the MRI and my concern for likely multifocal primary brain tumor.  We did state that we cannot rule out metastatic disease at this time.  Given the location and multifocal nature, there is no role for resection but I did talk with him about possible stereotactic biopsy to gain diagnosis.  We did discuss that the main goal of doing this would be to drop  further therapy including chemotherapy and radiation.  He states that his brother would not want to undergo all that treatment at his age and given his other comorbidities and I do think this is reasonable.  He is on low-dose steroids for the edema and they can continue this.  I did recommend to the medical team to consider palliative care/hospice consultation.  Given the current Covid status, there is no intervention that would be done urgently.  If the family changes their decision and would like to consider a biopsy, I am glad to reevaluate.   1.  Diagnosis: Multifocal left-sided brain lesions  2.  Plan -No urgent intervention, after discussion with the family we will hold off on any biopsy.

## 2019-11-27 LAB — GLUCOSE, CAPILLARY
Glucose-Capillary: 132 mg/dL — ABNORMAL HIGH (ref 70–99)
Glucose-Capillary: 186 mg/dL — ABNORMAL HIGH (ref 70–99)
Glucose-Capillary: 161 mg/dL — ABNORMAL HIGH (ref 70–99)
Glucose-Capillary: 169 mg/dL — ABNORMAL HIGH (ref 70–99)

## 2019-11-27 MED ORDER — SODIUM CHLORIDE 0.9% FLUSH
3.0000 mL | INTRAVENOUS | Status: DC | PRN
  Administered 2019-11-28: 3 mL via INTRAVENOUS
  Filled 2019-11-27: qty 3

## 2019-11-27 MED ORDER — SODIUM CHLORIDE 0.9% FLUSH
3.0000 mL | Freq: Two times a day (BID) | INTRAVENOUS | Status: DC
  Administered 2019-11-27 – 2019-12-16 (×38): 3 mL via INTRAVENOUS

## 2019-11-27 NOTE — Progress Notes (Signed)
PALLIATIVE NOTE:  Referral received for [place] discussion in the setting of multiple brain lesions, questionable CNS lymphoma, multi focal high-grade glioma, or glioblastoma. Patient is alert however is confused and unable to appropriately make medical decisions or engage in goals of care.   Patient's brother Cristien Jellison is next-of-kin. Voicemail left on listed number [phone]). My contact information left for return call. Will await call from brother or will re-attempt to reach out to him at a later date/time.   Detailed note and recommendation to follow, once goals of care has been completed with brother.   Thank you for your referral and allowing Palliative to assist in Mr. Zecher care.   Alda Lea, AGPCNP-BC Palliative Medicine Team  Phone: [phone] Amion: N. [place]   NO CHARGE

## 2019-11-27 NOTE — Plan of Care (Signed)
  Problem: Clinical Measurements: Goal: Will remain free from infection Outcome: Progressing   Problem: Clinical Measurements: Goal: Diagnostic test results will improve Outcome: Progressing   Problem: Clinical Measurements: Goal: Respiratory complications will improve Outcome: Progressing   

## 2019-11-27 NOTE — Progress Notes (Signed)
PROGRESS NOTE    Celestine Laviola  [id number] DOB: Apr 29, 1934 DOA: 11/25/2019 PCP: Kirk Ruths, MD    Brief Narrative:  Jaisen Harbeck is a 85 y.o. male with medical history significant of cognitive decline, questionable Alzheimer's disease, COPD, diabetes mellitus type 2, pernicious anemia, hyperlipidemia, type 2 diabetes mellitus, pancytopenia in the past, hypertension was brought in by EMS after brother found him not being in his usual cognitive self and had gurgling of speech. CT of the head revealed vasogenic edema in the left temporoparietal lobe with question of underlying mass measuring 3.6.  Neurosurgery was consulted.  Patient also found with COVID-19 positive on admition.  Consultants:   Neurosurgery  Procedures: CT of the head and MRI  Antimicrobials:   None   Subjective: Per nurses patient is a still confused.  Somewhat alert but not oriented.  Not requiring any supplemental oxygen as of this morning.   Objective: Vitals:   11/26/19 1558 11/26/19 2009 11/27/19 0633 11/27/19 0833  BP: 126/62 140/71 126/68 124/61  Pulse: 70 92 67 67  Resp: 14 20 20 18   Temp: 97.9 F (36.6 C) 97.8 F (36.6 C) 97.8 F (36.6 C) 97.7 F (36.5 C)  TempSrc: Oral Oral Oral Oral  SpO2: 100% 95% 95% 97%  Weight:   73.7 kg   Height:       No intake or output data in the 24 hours ending 11/27/19 1359 Filed Weights   11/25/19 1448 11/26/19 0354 11/27/19 [id number]  Weight: 86.2 kg 77 kg 73.7 kg    Examination: From 11/26/2019  General exam: Appears calm and comfortable , but confused Respiratory system: Clear to auscultation. Respiratory effort normal. Cardiovascular system: S1 & S2 heard, RRR. No JVD, murmurs, rubs, gallops or clicks. No pedal edema. Gastrointestinal system: Abdomen is nondistended, soft and nontender. Normal bowel sounds heard. Central nervous system: awake, confused, no focal neurological deficits. Extremities: no edema Skin: Warm and dry Psychiatry:  Mood & affect appropriate in current setting.    Data Reviewed: I have personally reviewed following labs and imaging studies  CBC: Recent Labs  Lab 11/25/19 1513 11/26/19 0710  WBC 5.9 4.4  NEUTROABS 3.9 2.3  HGB 12.9* 11.9*  HCT 37.8* 33.7*  MCV 91.5 88.2  PLT 166 [id number]   Basic Metabolic Panel: Recent Labs  Lab 11/25/19 1513 11/26/19 0710  NA 136 137  K 4.0 3.5  CL 99 102  CO2 24 26  GLUCOSE 106* 109*  BUN 13 12  CREATININE 1.14 1.15  CALCIUM 9.6 9.1   GFR: Estimated Creatinine Clearance: 50.7 mL/min (by C-G formula based on SCr of 1.15 mg/dL). Liver Function Tests: Recent Labs  Lab 11/25/19 1513 11/26/19 0710  AST 18 14*  ALT 13 9  ALKPHOS 50 48  BILITOT 1.2 0.8  PROT 7.2 6.3*  ALBUMIN 3.8 3.3*   No results for input(s): LIPASE, AMYLASE in the last 168 hours. No results for input(s): AMMONIA in the last 168 hours. Coagulation Profile: No results for input(s): INR, PROTIME in the last 168 hours. Cardiac Enzymes: No results for input(s): CKTOTAL, CKMB, CKMBINDEX, TROPONINI in the last 168 hours. BNP (last 3 results) No results for input(s): PROBNP in the last 8760 hours. HbA1C: Recent Labs    11/26/19 0441  HGBA1C 5.7*   CBG: Recent Labs  Lab 11/26/19 1144 11/26/19 1723 11/26/19 2235 11/27/19 0836 11/27/19 1120  GLUCAP 150* 180* 183* 132* 186*   Lipid Profile: No results for input(s): CHOL, HDL, LDLCALC, TRIG, CHOLHDL, LDLDIRECT in  the last 72 hours. Thyroid Function Tests: No results for input(s): TSH, T4TOTAL, FREET4, T3FREE, THYROIDAB in the last 72 hours. Anemia Panel: Recent Labs    11/26/19 0710  FERRITIN 3   Sepsis Labs: Recent Labs  Lab 11/26/19 0710  PROCALCITON <0.10    Recent Results (from the past 240 hour(s))  SARS CORONAVIRUS 2 (TAT 6-24 HRS) Nasopharyngeal Nasopharyngeal Swab     Status: Abnormal   Collection Time: 11/25/19  3:46 PM   Specimen: Nasopharyngeal Swab  Result Value Ref Range Status   SARS  Coronavirus 2 POSITIVE (A) NEGATIVE Final    Comment: RESULT CALLED TO, READ BACK BY AND VERIFIED WITH: Juliane Lack, RN 0518 11/26/19 G.MCADOO (NOTE) SARS-CoV-2 target nucleic acids are DETECTED. The SARS-CoV-2 RNA is generally detectable in upper and lower respiratory specimens during the acute phase of infection. Positive results are indicative of the presence of SARS-CoV-2 RNA. Clinical correlation with patient history and other diagnostic information is  necessary to determine patient infection status. Positive results do not rule out bacterial infection or co-infection with other viruses.  The expected result is Negative. Fact Sheet for Patients: SugarRoll.be Fact Sheet for Healthcare Providers: https://www.woods-mathews.com/ This test is not yet approved or cleared by the [place] FDA and  has been authorized for detection and/or diagnosis of SARS-CoV-2 by FDA under an Emergency Use Authorization (EUA). This EUA will remain  in effect (meaning this test can be used) for th e duration of the COVID-19 declaration under Section 564(b)(1) of the Act, 21 U.S.C. section 360bbb-3(b)(1), unless the authorization is terminated or revoked sooner. Performed at [facility], [place] [address], [place], [place] [address]      Radiology Studies: Ct Head Wo Contrast  Result Date: 11/25/2019 CLINICAL DATA:  Altered level of consciousness. EXAM: CT HEAD WITHOUT CONTRAST TECHNIQUE: Contiguous axial images were obtained from the base of the skull through the vertex without intravenous contrast. COMPARISON:  None. FINDINGS: Brain: There is a moderate to large volume of vasogenic edema in the left temporoparietal lobe. There are few associated hyperdense areas within this region. There is a mild rightward midline shift measuring approximately 4-5 mm. There is some mass effect on the temporal horn of the left lateral ventricle. There is suggestion of a  subtle underlying mass measuring approximately 3.6 cm. There is mild age related atrophy with chronic microvascular ischemic changes. Vascular: No hyperdense vessel or unexpected calcification. Skull: Normal. Negative for fracture or focal lesion. Sinuses/Orbits: No acute finding. Other: None. IMPRESSION: 1. There vasogenic edema in the left temporoparietal lobe with suggestion of a subtle underlying mass measuring approximately 3.6 cm. Further evaluation with MRI with and without contrast is recommended. 2. Small hyperdense areas at the periphery of this area of vasogenic edema likely representing a small volume of intra-axial blood products. 3. There is a mild rightward midline shift measuring 4-5 mm. These results were called by telephone at the time of interpretation on 11/25/2019 at 3:46 pm to provider [facility] , who verbally acknowledged these results. Electronically Signed   By: Constance Holster M.D.   On: 11/25/2019 15:48   Ct Chest W Contrast  Result Date: 11/26/2019 CLINICAL DATA:  Brain lesions on recent head CT and brain MRI suspicious for metastatic disease. Evaluate for possible primary tumor. EXAM: CT CHEST, ABDOMEN, AND PELVIS WITH CONTRAST TECHNIQUE: Multidetector CT imaging of the chest, abdomen and pelvis was performed following the standard protocol during bolus administration of intravenous contrast. CONTRAST:  174mL OMNIPAQUE IOHEXOL  300 MG/ML  SOLN COMPARISON:  None. FINDINGS: CT CHEST FINDINGS Cardiovascular: The heart is normal in size. No pericardial effusion. There is mild tortuosity and calcification of the thoracic aorta but no focal aneurysm or dissection. Scattered three-vessel coronary artery calcifications are noted. Mediastinum/Nodes: No mediastinal or hilar mass or adenopathy. The esophagus is grossly normal. Lungs/Pleura: Emphysematous changes and pulmonary scarring. No acute pulmonary findings. No worrisome pulmonary lesions. No pulmonary nodules to suggest metastatic  disease. No pleural effusions or pleural lesions. Musculoskeletal: No chest wall mass, supraclavicular or axillary lymphadenopathy. The bony thorax is intact. No worrisome bone lesions to suggest metastatic disease. CT ABDOMEN PELVIS FINDINGS Hepatobiliary: Scattered benign-appearing hepatic cysts. No worrisome hepatic lesions or intrahepatic biliary dilatation. Rim calcified gallstones are noted in the gallbladder. No common bile duct dilatation. Pancreas: No mass, inflammation or ductal dilatation. Spleen: Normal size.  No focal lesions. Adrenals/Urinary Tract: The adrenal glands and kidneys are unremarkable. No worrisome renal lesions or hydronephrosis. Thick-walled bladder likely due to partial bladder outlet obstruction. No bladder mass is identified. Stomach/Bowel: The stomach, duodenum, small bowel and colon are grossly normal without oral contrast. No acute inflammatory changes, mass lesions or obstructive findings. There are surgical changes in the right upper quadrant with partial right hemicolectomy. No masses identified. Vascular/Lymphatic: Advanced atherosclerotic calcifications involving the aorta and branch vessel ostia and iliac arteries but no aneurysm or dissection. The major venous structures are patent. Small scattered mesenteric and retroperitoneal lymph nodes but no mass or adenopathy. Reproductive: Enlarged cystic appearing seminal vesicles bilaterally with rim calcifications on the left. Prostate gland is mildly enlarged. Other: No pelvic mass or pelvic lymphadenopathy. No inguinal mass or adenopathy. Small bilateral inguinal hernias containing fat. Musculoskeletal: No significant bony findings. IMPRESSION: 1. No CT findings for primary neoplasm or metastatic disease in the chest, abdomen or pelvis. 2. Emphysematous changes but no acute pulmonary findings or worrisome pulmonary lesions. 3. Simple appearing hepatic cysts. 4. Evidence of prior partial right hemicolectomy. 5. Cholelithiasis. 6.  No findings for osseous metastatic disease. Electronically Signed   By: Marijo Sanes M.D.   On: 11/26/2019 08:26   Mr Jeri Cos And Wo Contrast  Result Date: 11/25/2019 CLINICAL DATA:  85 year old male with altered mental status and abnormal noncontrast head CT earlier today demonstrating left hemisphere edema and mass effect. EXAM: MRI HEAD WITHOUT AND WITH CONTRAST TECHNIQUE: Multiplanar, multiecho pulse sequences of the brain and surrounding structures were obtained without and with intravenous contrast. CONTRAST:  84mL GADAVIST GADOBUTROL 1 MMOL/ML IV SOLN COMPARISON:  Head CT 1532 hours today.  Brain MRI 10/18/2014. FINDINGS: Brain: There are multiple enhancing masses affecting the left hemisphere. The largest lesion is a rim enhancing cystic, partially hemorrhagic mass centered in the posterior left temporal lobe measuring 41 millimeters diameter (series 15, image 13 and series 16, image 16). Anterior to the dominant mass is an adjacent solidly enhancing round 18 millimeter lesion (series 15, image 12) with mildly decreased T2 signal (series 10, image 12) and mildly restricted diffusion. In the nearby left deep gray matter nuclei cauda thalamic groove there is a similar round more solidly enhancing 14 millimeter mass (series 15, image 12). Furthermore there is another somewhat T2 hypointense oval enhancing mass occupying the left hippocampus measuring 18 millimeters (series 16, image 16 and series 12, image 16). That lesion is also somewhat restricted on diffusion. And finally, in the left middle cranial fossa there is an intra-axial versus extra-axial round 30 millimeter diameter solidly enhancing mass on series 15, image  7. That lesion seems to have adjacent dural thickening (same image). But no other dural thickening is identified throughout the brain. And on sagittal T1 images (series 13, image 16) the lesion appears more intra-axial. All of these are new since the 2015 MRI. There is subsequent abundant  T2 and FLAIR hyperintensity resembling vasogenic edema, present throughout the left temporal lobe, tracking into the posterior deep white matter capsules and toward the superior left perirolandic cortex. Associated mass effect on the left lateral ventricle and 5 millimeters of rightward midline shift. Basilar cisterns remain patent. Conspicuous absence of lesions in the right hemisphere. No brainstem or posterior fossa involvement. Negative visible cervical spinal cord. No superimposed restricted diffusion suggestive of acute infarction. No ventriculomegaly, ependymal enhancement, or acute intracranial hemorrhage. Cervicomedullary junction and pituitary are within normal limits. Progressed mild white matter signal changes in the right hemisphere suggesting chronic small vessel disease. No cerebral blood products identified outside of the dominant enhancing mass. Vascular: Major intracranial vascular flow voids are stable since 2015. Dominant and tortuous right vertebral artery. The major dural venous sinuses seem to be patent. Skull and upper cervical spine: Visualized bone marrow signal is within normal limits. Sinuses/Orbits: Stable and negative orbits. Paranasal sinus mucosal thickening has regressed since 2015. Other: Mild chronic left mastoid effusion has not significantly changed. Visible internal auditory structures appear normal. Scalp and face soft tissues appear negative. IMPRESSION: 1. There is a heterogeneous population of Five enhancing brain masses limited to the left hemisphere. All are new from a 2015 brain MRI. They range from solid and 14 mm diameter to cystic/partially hemorrhagic measuring 41 mm diameter. Diffusion and T2 signal varies between the masses. And one of the lesions (left middle cranial fossa, 30 mm) might be extra-axial and seems to have a small area of associated dural thickening. 2. Infiltrating, multifocal high-grade Glioma or Glioblastoma is a possibility. CNS Lymphoma  (particularly large B-cell type) is also a consideration. Metastatic disease is possible but multiple lesions this size limited to the left hemisphere would be highly unusual. Superimposed meningioma was considered for the left middle cranial fossa lesion, but not favored. Infectious or inflammatory etiology is felt unlikely, and the dominant cystic lesion does NOT have MRI characteristics of abscess. 3. Associated edema versus nonenhancing tumor in the left hemisphere with mass effect on the left lateral ventricle and rightward midline shift of 5 mm. Basilar cisterns remain patent and there is no ventriculomegaly. Electronically Signed   By: Genevie Ann M.D.   On: 11/25/2019 19:32   Ct Abdomen Pelvis W Contrast  Result Date: 11/26/2019 CLINICAL DATA:  Brain lesions on recent head CT and brain MRI suspicious for metastatic disease. Evaluate for possible primary tumor. EXAM: CT CHEST, ABDOMEN, AND PELVIS WITH CONTRAST TECHNIQUE: Multidetector CT imaging of the chest, abdomen and pelvis was performed following the standard protocol during bolus administration of intravenous contrast. CONTRAST:  150mL OMNIPAQUE IOHEXOL 300 MG/ML  SOLN COMPARISON:  None. FINDINGS: CT CHEST FINDINGS Cardiovascular: The heart is normal in size. No pericardial effusion. There is mild tortuosity and calcification of the thoracic aorta but no focal aneurysm or dissection. Scattered three-vessel coronary artery calcifications are noted. Mediastinum/Nodes: No mediastinal or hilar mass or adenopathy. The esophagus is grossly normal. Lungs/Pleura: Emphysematous changes and pulmonary scarring. No acute pulmonary findings. No worrisome pulmonary lesions. No pulmonary nodules to suggest metastatic disease. No pleural effusions or pleural lesions. Musculoskeletal: No chest wall mass, supraclavicular or axillary lymphadenopathy. The bony thorax is intact. No worrisome bone  lesions to suggest metastatic disease. CT ABDOMEN PELVIS FINDINGS  Hepatobiliary: Scattered benign-appearing hepatic cysts. No worrisome hepatic lesions or intrahepatic biliary dilatation. Rim calcified gallstones are noted in the gallbladder. No common bile duct dilatation. Pancreas: No mass, inflammation or ductal dilatation. Spleen: Normal size.  No focal lesions. Adrenals/Urinary Tract: The adrenal glands and kidneys are unremarkable. No worrisome renal lesions or hydronephrosis. Thick-walled bladder likely due to partial bladder outlet obstruction. No bladder mass is identified. Stomach/Bowel: The stomach, duodenum, small bowel and colon are grossly normal without oral contrast. No acute inflammatory changes, mass lesions or obstructive findings. There are surgical changes in the right upper quadrant with partial right hemicolectomy. No masses identified. Vascular/Lymphatic: Advanced atherosclerotic calcifications involving the aorta and branch vessel ostia and iliac arteries but no aneurysm or dissection. The major venous structures are patent. Small scattered mesenteric and retroperitoneal lymph nodes but no mass or adenopathy. Reproductive: Enlarged cystic appearing seminal vesicles bilaterally with rim calcifications on the left. Prostate gland is mildly enlarged. Other: No pelvic mass or pelvic lymphadenopathy. No inguinal mass or adenopathy. Small bilateral inguinal hernias containing fat. Musculoskeletal: No significant bony findings. IMPRESSION: 1. No CT findings for primary neoplasm or metastatic disease in the chest, abdomen or pelvis. 2. Emphysematous changes but no acute pulmonary findings or worrisome pulmonary lesions. 3. Simple appearing hepatic cysts. 4. Evidence of prior partial right hemicolectomy. 5. Cholelithiasis. 6. No findings for osseous metastatic disease. Electronically Signed   By: Marijo Sanes M.D.   On: 11/26/2019 08:26   Dg Chest Portable 1 View  Result Date: 11/25/2019 CLINICAL DATA:  Altered mental status. EXAM: PORTABLE CHEST 1 VIEW  COMPARISON:  CT chest 06/16/2008.  Chest x-ray 06/16/2008. FINDINGS: Mediastinum and hilar structures normal. Faint solitary nodular opacities noted over both lung bases most consistent nipple shadows. Lungs are clear acute infiltrates. No pleural effusion or pneumothorax. Heart size normal. No pulmonary vascular congestion. Degenerative change thoracic spine. IMPRESSION: No acute cardiopulmonary disease. Electronically Signed   By: Marcello Moores  Register   On: 11/25/2019 15:52    Scheduled Meds:  dexamethasone (DECADRON) injection  4 mg Intravenous Q12H   insulin aspart  0-15 Units Subcutaneous TID WC   LORazepam  0.5 mg Intravenous Q4H   pravastatin  20 mg Oral Daily   vitamin C  500 mg Oral Daily   zinc sulfate  220 mg Oral Daily   Continuous Infusions:  Assessment & Plan:   Active Problems:   Type 2 diabetes mellitus with hyperlipidemia (HCC)   Mental status alteration   History of pancytopenia  1.Altered MS-still present  - likely secondary tovasogenic edema in the left temporoparietal lobe with question of underlying mass measuring 3.6 MRI with Five enhancing brain masses, could be CNS lymphoma or even multi focal high-grade glioma or glioblastoma please see full MRI results masses limited to the left hemisphere. - Per Neurosurgery, Given the location and multifocal nature, there is no role for resection but I did talk with him about possible stereotactic biopsy to gain diagnosis.  We did discuss that the main goal of doing this would be to drop further therapy including chemotherapy and radiation.  He states that his brother would not want to undergo all that treatment at his age and given his other comorbidities and I do think this is reasonable." Continue on Decadron 4 mg IV twice daily for now , may need to transition to po steroid upon d/c (2mg  bid) Neurosurgery's input was appreciated recommended no urgent intervention, after discussion  with the family we will hold off on any  biopsy Neurochecks Seizure precautions Will consult palliative care, as likely pt will need hospice care.  - Palliative care consulted -- Awaiting recs   2.  Dyslipidemia- Continue his home pravastatin  3.   history of mild pancytopenia -Continue to monitor  4.  Diabetes mellitus type 2- Blood glucose stable  H A1c is 5.7 on this admission Check fingersticks Sliding scale Hold home antiglycemic medications  5.  Covid 19+: Stable and not needing any supplementary oxygen -No obvious signs of symptoms so far  -Is on Decadron as mentioned above -Covid precaution  DVT prophylaxis: SCD Code Status: DNR- Family Communication: none at bedside Disposition Plan: likely be here 1-2 more days, until medically stable. Palliative care consulted pending. Will need hospice too.     LOS: 2 days     Thornell Mule, MD [facility] Pager [phone]  If 7PM-7AM, please contact night-coverage [email or web address] Password [facility] 11/27/2019, 1:59 PM

## 2019-11-28 DIAGNOSIS — Z66 Do not resuscitate: Secondary | ICD-10-CM

## 2019-11-28 DIAGNOSIS — D496 Neoplasm of unspecified behavior of brain: Secondary | ICD-10-CM

## 2019-11-28 LAB — GLUCOSE, CAPILLARY
Glucose-Capillary: 128 mg/dL — ABNORMAL HIGH (ref 70–99)
Glucose-Capillary: 163 mg/dL — ABNORMAL HIGH (ref 70–99)
Glucose-Capillary: 165 mg/dL — ABNORMAL HIGH (ref 70–99)

## 2019-11-28 MED ORDER — LORAZEPAM 2 MG/ML IJ SOLN
1.0000 mg | INTRAMUSCULAR | Status: DC | PRN
  Administered 2019-11-28 – 2019-11-29 (×3): 1 mg via INTRAVENOUS
  Filled 2019-11-28 (×2): qty 1

## 2019-11-28 MED ORDER — MORPHINE SULFATE (PF) 2 MG/ML IV SOLN: 1.0000 mg | INTRAVENOUS | Status: DC | PRN

## 2019-11-28 MED ORDER — GLYCOPYRROLATE 0.2 MG/ML IJ SOLN
0.2000 mg | INTRAMUSCULAR | Status: DC | PRN
  Filled 2019-11-28: qty 1

## 2019-11-28 MED ORDER — BIOTENE DRY MOUTH MT LIQD: 15.0000 mL | OROMUCOSAL | Status: DC | PRN

## 2019-11-28 MED ORDER — ONDANSETRON HCL 4 MG/2ML IJ SOLN: 4.0000 mg | Freq: Four times a day (QID) | INTRAMUSCULAR | Status: DC | PRN

## 2019-11-28 MED ORDER — ONDANSETRON 4 MG PO TBDP
4.0000 mg | ORAL_TABLET | Freq: Four times a day (QID) | ORAL | Status: DC | PRN
  Filled 2019-11-28: qty 1

## 2019-11-28 MED ORDER — POLYVINYL ALCOHOL 1.4 % OP SOLN
1.0000 [drp] | Freq: Four times a day (QID) | OPHTHALMIC | Status: DC | PRN
  Filled 2019-11-28: qty 15

## 2019-11-28 MED ORDER — MORPHINE SULFATE (CONCENTRATE) 10 MG/0.5ML PO SOLN: 10.0000 mg | ORAL | Status: DC | PRN

## 2019-11-28 NOTE — Consult Note (Addendum)
Consultation Note Date: 11/28/2019   Patient Name: Bryan Buck  DOB: [birth year]  MRN: [id number]  Age / Sex: 85 y.o., male   PCP: Kirk Ruths, MD Referring Physician: Thornell Mule, MD   REASON FOR CONSULTATION:Establishing goals of care  Palliative Care consult requested for goals of care in this 85 y.o. male with multiple medical problems including questionable Alzheimer's disease, colon cancer, COPD, diabetes mellitus type 2, pernicious anemia, hyperlipidemia, pancytopenia, hypertension, cognitive decline. He presented to ED after brother found him with confusion and not acting like his normal self. Brother reports he speaks with patient 2-3 times a week and he is usually coherent and cares for himself. He reports speech was garbled and he was repeating things. Brother called EMS and the Sheriff's department to do an emergency welfare check with concerns patient was having a stroke. During his ED work-up patient found to be COVID-19 positive. CT of the head revealed vasogenic edema in the left temporoparietal lobe with question of underlying mass measuring 3.6. Rightward midline shift 24mm. MRI ill-defined enhancing brain mass questionable for CNS lymphoma or multifocal high-grade glioma, or glioblastoma.  This admission patient has been seen by neurosurgery. No further recommendations or interventions. Neurosurgeon discussed case with patient's brother and offered stereotactic biopsy to gain diagnosis with potential options of chemotherapy and radiation depending on findings, however brother declined for biopsy and further work-up at that time.  Clinical Assessment and Goals of Care: I have reviewed medical records including lab results, imaging, Epic notes, and MAR, received report from the bedside RN, and assessed the patient. I was spoke with patient's brother/POA, [name] to discuss diagnosis prognosis, GOC, EOL wishes, disposition and options. Patient is awake, alert,  but with some confusion and unable to appropriate engage in goals of care discussion or decision making.   I introduced Palliative Medicine as specialized medical care for people living with serious illness. It focuses on providing relief from the symptoms and stress of a serious illness. The goal is to improve quality of life for both the patient and the family. [name] verbalized understanding and appreciation of our involvement.  We discussed a brief life review of the patient, along with his functional and nutritional status.  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  He is divorced and never had any children.  Brother states he and his brother were the only children.   [name] reports has not seen patient in several years as they do not live near each other.  He does report they talk 2-3 times weekly if not more.  He reports patient lives alone and generally provides all care for himself.  Brother states patient has always had his affairs in order and from earlier adult he has always had a living will which specified [name] as he is POA and listed his wishes in detail regarding all assets in the event his health declined or he suddenly passed away. [name] reports patient would tell him every few years that he updated the living will and would be mailing him the copy as he gained more land or sold things.   [name] was tearful in discussion stating he had not seen his brother in some time and was heart broken when he actually got to the house to see him. He reports he has always been a well kept gentleman who took great pride in his home and self.  Unfortunately this  has not been the case which further concern him after seeing his brother and his appearance.  Brother states patient had lost a significant amount of weight, facial hair, and seem to have not been taking proper care of himself.  He speaks to "horrific living arrangements in the home!"   He states that share of an EMS required respirators to enter the home as patient had over 15 cats with feces throughout the home, hundreds of empty bags and containers of cat food, no running water, and the house was so dirty and things piled up as if he was hoarding. [name] reports there was a pathway made that allowed you to get through the house. Therapeutic listening and support given. He is tearful stating he wished he had come to visit him more and knew that this was how he was living. He reports he feels his health must have been changing with periods of confusion over time because this was not the brother he knew and obviously he was not able to pick up on it during their phone calls.   We discussed His current illness and what it means in the larger context of His on-going co-morbidities. With specific discussions regarding his brain lesions, confusion, and overall decline. Natural disease trajectory and expectations at EOL were discussed.  [name] reports his brother was a very open man and had no problems discussing his health issues. He reports he has always made it known to him his wishes and he knows his brother would not want to live in his current condition if he could make a sound decision. He reports he would not wish to undergo a biopsy, chemotherapy, or aggressive measures. Support given.   [name] reports his wishes for his brother is to be comfortable and live life however he wants the last few days with no limitations. He is tearful in expressing "I wish I was in a good state of health to care for him I would bring him home with me but I can not and I am recovering from a heart attack and bypass surgery myself!" He shares that he knows patient cannot return to his home as it is in no living situation and he would not be able to live alone given his state of health.   I attempted to elicit values and goals of care important to the patient.    The difference between aggressive medical  intervention and comfort care was considered in light of the patient's goals of care. Brother expresses his wishes for patient's care to focus on comfort only with no aggressive interventions or further work-up regarding his possible brain cancer. I educated Mr. Rissmiller on what comfort care measures would look like. I discussed with him his brother would no longer receive aggressive medical interventions such as continuous vital signs, lab work, radiology testing, or medications not focused on comfort. All care will focus on how the patient is looking and feeling. This will include management of any symptoms that may cause discomfort, pain, shortness of breath, cough, nausea, agitation, anxiety, and/or secretions etc. Symptoms would be managed with medications and other non-pharmacological interventions such as spiritual support if requested, repositioning, music therapy, or therapeutic listening. [name] verbalized understanding and appreciation again expressing this is the care he would like to have for his brother.   He states "Purav has lived a great life and he chose the life he lived. He was satisfied with it and he has been a great brother to me. It is  now time for me to be great to him and make sure his wishes are respected. I do not want him to suffer. I am hoping he will have a peaceful rest of his life until he dies!" Support given.   Patient has an advanced directive naming Cleophis Prendes (brother) as his POA and also indicates his wishes for DNR/DNI. This document was faxed by [name] to providers. [name] confirms wishes for DNR/DNI and comfort care requesting his familiarity with hospice and hoping they can become involved.   Hospice services outpatient were explained and offered. [name] verbalized their understanding and awareness of hospice's goals and philosophy of care. I explained to him care at hospice home and hospice outpatient. [name] has recently spoken with Judson Roch, CSW. He is aware at this time  the team will have to evaluate what is the best disposition for patient given he cannot return home or with family as well as the challenge of him being COVID +. [name] verbalized understanding and states he is ok if DSS has to be involved as he has no other options and is "at the mercy of whatever the medical team decides".   Questions and concerns were addressed. The family was encouraged to call with questions or concerns.  PMT will continue to support holistically.   SOCIAL HISTORY:     reports that he has quit smoking. His smoking use included cigarettes. He has a 40.00 pack-year smoking history. He quit smokeless tobacco use about 26 years ago. He reports current alcohol use of about 6.0 standard drinks of alcohol per week. He reports that he does not use drugs.  CODE STATUS: DNR  ADVANCE DIRECTIVES: [name] (brother/POA)   SYMPTOM MANAGEMENT: see below   Palliative Prophylaxis:   Aspiration, Bowel Regimen, Delirium Protocol, Frequent Pain Assessment and Oral Care  PSYCHO-SOCIAL/SPIRITUAL:  Support System: Family   Desire for further Chaplaincy support:NO   Additional Recommendations (Limitations, Scope, Preferences):  Full Comfort Care   PAST MEDICAL HISTORY: Past Medical History:  Diagnosis Date  . Cancer ([place])    colon  . Diabetes mellitus without complication ([place])   . Generalized headaches 06/15/2015   pt reported    PAST SURGICAL HISTORY:  Past Surgical History:  Procedure Laterality Date  . EYE SURGERY     lens implant right eye  . HERNIA REPAIR    . IRRIGATION AND DEBRIDEMENT KNEE Right 01/16/2017   Procedure: IRRIGATION AND DEBRIDEMENT KNEE;  Surgeon: Corky Mull, MD;  Location: [facility] ORS;  Service: Orthopedics;  Laterality: Right;  . KNEE ARTHROSCOPY Right 01/16/2017   Procedure: ARTHROSCOPY KNEE;  Surgeon: Corky Mull, MD;  Location: [facility] ORS;  Service: Orthopedics;  Laterality: Right;    ALLERGIES:  has No Known Allergies.   MEDICATIONS:   Current Facility-Administered Medications  Medication Dose Route Frequency Provider Last Rate Last Dose  . albuterol (VENTOLIN HFA) 108 (90 Base) MCG/ACT inhaler 2 puff  2 puff Inhalation Q6H PRN Nolberto Hanlon, MD      . dexamethasone (DECADRON) injection 4 mg  4 mg Intravenous Q12H Nolberto Hanlon, MD   4 mg at 11/28/19 0947  . insulin aspart (novoLOG) injection 0-15 Units  0-15 Units Subcutaneous TID WC Nolberto Hanlon, MD   3 Units at 11/28/19 1304  . LORazepam (ATIVAN) injection 0.5 mg  0.5 mg Intravenous Q4H Nolberto Hanlon, MD   0.5 mg at 11/28/19 0056  . pravastatin (PRAVACHOL) tablet 20 mg  20 mg Oral Daily Nolberto Hanlon, MD   20 mg  at 11/28/19 0946  . sodium chloride flush (NS) 0.9 % injection 3 mL  3 mL Intravenous Q12H Thornell Mule, MD   3 mL at 11/28/19 0948  . sodium chloride flush (NS) 0.9 % injection 3 mL  3 mL Intravenous PRN Thornell Mule, MD   3 mL at 11/28/19 0056  . vitamin C (ASCORBIC ACID) tablet 500 mg  500 mg Oral Daily Lang Snow, NP   500 mg at 11/28/19 0946  . zinc sulfate capsule 220 mg  220 mg Oral Daily Lang Snow, NP   220 mg at 11/28/19 0946    VITAL SIGNS: BP 139/68 (BP Location: Left Arm)   Pulse 64   Temp 97.6 F (36.4 C) (Oral)   Resp 18   Ht 6' (1.829 m)   Wt 77.7 kg   SpO2 100%   BMI 23.25 kg/m  Filed Weights   11/26/19 0354 11/27/19 0633 11/28/19 0430  Weight: 77 kg 73.7 kg 77.7 kg    Estimated body mass index is 23.25 kg/m as calculated from the following:   Height as of this encounter: 6' (1.829 m).   Weight as of this encounter: 77.7 kg.  LABS: CBC:    Component Value Date/Time   WBC 4.4 11/26/2019 0710   HGB 11.9 (L) 11/26/2019 0710   HGB 14.2 03/18/2015 0947   HCT 33.7 (L) 11/26/2019 0710   HCT 41.3 03/18/2015 0947   PLT 150 11/26/2019 0710   PLT 112 (L) 03/18/2015 0947   Comprehensive Metabolic Panel:    Component Value Date/Time   NA 137 11/26/2019 0710   NA 135 03/18/2015 0947   K 3.5 11/26/2019 0710    K 4.6 03/18/2015 0947   CO2 26 11/26/2019 0710   CO2 30 03/18/2015 0947   BUN 12 11/26/2019 0710   BUN 11 03/18/2015 0947   CREATININE 1.15 11/26/2019 0710   CREATININE 1.00 03/18/2015 0947   ALBUMIN 3.3 (L) 11/26/2019 0710   ALBUMIN 4.2 03/18/2015 0947     Prognosis: Unable to determine-Poor in the setting of vasogenic edema, 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  Discharge Planning:  To Be Determined  Recommendations:  DNR/DNI-as confirmed by brother/POA  Brother has requested to transition all care to full comfort/EOL. Declines further work-up or medical interventions.   Hospice is not accepting COVID+ patient's per liasion and patient is borderline for meeting criteria for hospice home facility given required diagnosis of 2 weeks or less, however, once transition to comfort may become more appropriate over time. I have communicated with Judson Roch, CSW for uncertainty of what disposition options are. He does not have any family other than brother who is unable to care for him.   Will d/c all orders not comfort focused.   Morphine PRN for pain  Robinul as needed for excessive secretions  Ativan as needed for agitation/anxiety/seizures  Zofran as needed for nausea  Liquifilm Tears as needed for dry eyes  PMT will continue to support and follow   Palliative Performance Scale: PPS 20-30%, ambulatory               Brother/POA [name] expressed understanding and was in agreement with this plan.   Thank you for allowing the Palliative Medicine Team to assist in the care of this patient.  The above conversation was completed via telephone due to the visitor restrictions during the COVID-19 pandemic. Thorough chart review and  discussion with necessary members of the care team was completed as part of assessment. All issues were discussed and  addressed but no physical exam was performed.  Time In: 1330 Time Out: 1435 Time Total: 65 min.   Visit consisted of counseling and education dealing with the complex and emotionally intense issues of symptom management and palliative care in the setting of serious and potentially life-threatening illness.Greater than 50%  of this time was spent counseling and coordinating care related to the above assessment and plan.  Signed by:  Alda Lea, AGPCNP-BC Palliative Medicine Team  Phone: [phone] Fax: [phone] Pager: [phone] Amion: Bjorn Pippin
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
## 2019-11-28 NOTE — Progress Notes (Signed)
Phone called received from [employer] that camera will be turned off at this time due to 3 stat pages this shift.  Informed that camera was to remain on this shift per Director Posey Pronto.  Telesitter reports and confirms that camera will be turned off per their protocol at this time.

## 2019-11-28 NOTE — Progress Notes (Signed)
PROGRESS NOTE    Bryan Buck  [id number] DOB: 03-29-1934 DOA: 11/25/2019 PCP: Kirk Ruths, MD    Brief Narrative:  Bryan Buck is a 85 y.o. male with medical history significant of cognitive decline, questionable Alzheimer's disease, COPD, diabetes mellitus type 2, pernicious anemia, hyperlipidemia, type 2 diabetes mellitus, pancytopenia in the past, hypertension was brought in by EMS after brother found him not being in his usual cognitive self and had gurgling of speech. CT of the head revealed vasogenic edema in the left temporoparietal lobe with question of underlying mass measuring 3.6.  Neurosurgery was consulted.  Patient also found with COVID-19 positive on admition.  Consultants:   Neurosurgery  Procedures: CT of the head and MRI  Antimicrobials:   None   Subjective: As per nurse patient is alert but not quite oriented.  Is not requiring any supplemental oxygen.  Palliative care to visit the patient today.  Objective: Vitals:   11/27/19 2003 11/28/19 0430 11/28/19 0436 11/28/19 0829  BP: 127/62  122/62 139/68  Pulse: 64  62 64  Resp: 18  18   Temp: 98.5 F (36.9 C)  (!) 97.5 F (36.4 C) 97.6 F (36.4 C)  TempSrc: Oral  Oral Oral  SpO2: 99%  97% 100%  Weight:  77.7 kg    Height:        Intake/Output Summary (Last 24 hours) at 11/28/2019 1539 Last data filed at 11/27/2019 1800 Gross per 24 hour  Intake 360 ml  Output 1 ml  Net 359 ml   Filed Weights   11/26/19 0354 11/27/19 0633 11/28/19 0430  Weight: 77 kg 73.7 kg 77.7 kg    Examination: From 11/26/2019  General exam: Appears calm and comfortable , but confused Respiratory system: Clear to auscultation. Respiratory effort normal. Cardiovascular system: S1 & S2 heard, RRR. No JVD, murmurs, rubs, gallops or clicks. No pedal edema. Gastrointestinal system: Abdomen is nondistended, soft and nontender. Normal bowel sounds heard. Central nervous system: awake, confused, no focal  neurological deficits. Extremities: no edema Skin: Warm and dry Psychiatry: Mood & affect appropriate in current setting.    Data Reviewed: I have personally reviewed following labs and imaging studies  CBC: Recent Labs  Lab 11/25/19 1513 11/26/19 0710  WBC 5.9 4.4  NEUTROABS 3.9 2.3  HGB 12.9* 11.9*  HCT 37.8* 33.7*  MCV 91.5 88.2  PLT 166 [id number]   Basic Metabolic Panel: Recent Labs  Lab 11/25/19 1513 11/26/19 0710  NA 136 137  K 4.0 3.5  CL 99 102  CO2 24 26  GLUCOSE 106* 109*  BUN 13 12  CREATININE 1.14 1.15  CALCIUM 9.6 9.1   GFR: Estimated Creatinine Clearance: 53.4 mL/min (by C-G formula based on SCr of 1.15 mg/dL). Liver Function Tests: Recent Labs  Lab 11/25/19 1513 11/26/19 0710  AST 18 14*  ALT 13 9  ALKPHOS 50 48  BILITOT 1.2 0.8  PROT 7.2 6.3*  ALBUMIN 3.8 3.3*   No results for input(s): LIPASE, AMYLASE in the last 168 hours. No results for input(s): AMMONIA in the last 168 hours. Coagulation Profile: No results for input(s): INR, PROTIME in the last 168 hours. Cardiac Enzymes: No results for input(s): CKTOTAL, CKMB, CKMBINDEX, TROPONINI in the last 168 hours. BNP (last 3 results) No results for input(s): PROBNP in the last 8760 hours. HbA1C: Recent Labs    11/26/19 0441  HGBA1C 5.7*   CBG: Recent Labs  Lab 11/27/19 1120 11/27/19 1543 11/27/19 2013 11/28/19 0825 11/28/19 1142  GLUCAP  186* 161* 169* 128* 163*   Lipid Profile: No results for input(s): CHOL, HDL, LDLCALC, TRIG, CHOLHDL, LDLDIRECT in the last 72 hours. Thyroid Function Tests: No results for input(s): TSH, T4TOTAL, FREET4, T3FREE, THYROIDAB in the last 72 hours. Anemia Panel: Recent Labs    11/26/19 0710  FERRITIN 29   Sepsis Labs: Recent Labs  Lab 11/26/19 0710  PROCALCITON <0.10    Recent Results (from the past 240 hour(s))  SARS CORONAVIRUS 2 (TAT 6-24 HRS) Nasopharyngeal Nasopharyngeal Swab     Status: Abnormal   Collection Time: 11/25/19  3:46 PM    Specimen: Nasopharyngeal Swab  Result Value Ref Range Status   SARS Coronavirus 2 POSITIVE (A) NEGATIVE Final    Comment: RESULT CALLED TO, READ BACK BY AND VERIFIED WITH: Juliane Lack, RN 0518 11/26/19 G.MCADOO (NOTE) SARS-CoV-2 target nucleic acids are DETECTED. The SARS-CoV-2 RNA is generally detectable in upper and lower respiratory specimens during the acute phase of infection. Positive results are indicative of the presence of SARS-CoV-2 RNA. Clinical correlation with patient history and other diagnostic information is  necessary to determine patient infection status. Positive results do not rule out bacterial infection or co-infection with other viruses.  The expected result is Negative. Fact Sheet for Patients: SugarRoll.be Fact Sheet for Healthcare Providers: https://www.woods-mathews.com/ This test is not yet approved or cleared by the [place] FDA and  has been authorized for detection and/or diagnosis of SARS-CoV-2 by FDA under an Emergency Use Authorization (EUA). This EUA will remain  in effect (meaning this test can be used) for th e duration of the COVID-19 declaration under Section 564(b)(1) of the Act, 21 U.S.C. section 360bbb-3(b)(1), unless the authorization is terminated or revoked sooner. Performed at [facility], [place] [address] [place], [place] [address]      Radiology Studies: No results found.  Scheduled Meds:  dexamethasone (DECADRON) injection  4 mg Intravenous Q12H   sodium chloride flush  3 mL Intravenous Q12H   Continuous Infusions:  Assessment & Plan:   Active Problems:   Type 2 diabetes mellitus with hyperlipidemia (HCC)   Mental status alteration   History of pancytopenia  1.Altered MS-still present  - likely secondary tovasogenic edema in the left temporoparietal lobe with question of underlying mass measuring 3.6 MRI with Five enhancing brain masses, could be CNS lymphoma or even  multi focal high-grade glioma or glioblastoma please see full MRI results masses limited to the left hemisphere. - Per Neurosurgery, Given the location and multifocal nature, there is no role for resection but I did talk with him about possible stereotactic biopsy to gain diagnosis.  We did discuss that the main goal of doing this would be to drop further therapy including chemotherapy and radiation.  He states that his brother would not want to undergo all that treatment at his age and given his other comorbidities and I do think this is reasonable." Continue on Decadron 4 mg IV twice daily for now , may need to transition to po steroid upon d/c (2mg  bid) Neurosurgery's input was appreciated recommended no urgent intervention, after discussion with the family we will hold off on any biopsy Neurochecks Seizure precautions Will consult palliative care, as likely pt will need hospice care.  - Palliative care consulted --patient and patient POA decided for comfort care.  Orders placed in by the palliative care consultant.   2.  Covid 19+: Stable and not needing any supplementary oxygen -No obvious signs of symptoms so far  -Is on  Decadron as mentioned above -Covid precaution   Comfort care: Palliative care consulted.  Had a discussion with patient and family/POA with the palliative care consultant. Decided for comfort care Comfort care orders are in now Case manager social worker and palliative care nurse to work out on discharge plan where comfort care can be implemented  DVT prophylaxis: SCD Code Status:  Comfort care Family Communication: none at bedside Disposition Plan: Comfort care    LOS: 3 days     Thornell Mule, MD [facility] Pager [phone]  If 7PM-7AM, please contact night-coverage [email or web address] Password [email or web address] 11/28/2019, 3:39 PM

## 2019-11-29 MED ORDER — DEXAMETHASONE 4 MG PO TABS
2.0000 mg | ORAL_TABLET | Freq: Two times a day (BID) | ORAL | Status: DC
  Filled 2019-11-29 (×2): qty 0.5

## 2019-11-29 MED ORDER — DEXAMETHASONE 4 MG PO TABS
2.0000 mg | ORAL_TABLET | Freq: Two times a day (BID) | ORAL | Status: DC
  Administered 2019-11-30 – 2019-12-16 (×33): 2 mg via ORAL
  Filled 2019-11-29 (×35): qty 0.5

## 2019-11-29 NOTE — Progress Notes (Signed)
PROGRESS NOTE    Bryan Buck  [id number] DOB: 1934/01/10 DOA: 11/25/2019 PCP: Kirk Ruths, MD    Brief Narrative:  Bryan Buck is a 85 y.o. male with medical history significant of cognitive decline, questionable Alzheimer's disease, COPD, diabetes mellitus type 2, pernicious anemia, hyperlipidemia, type 2 diabetes mellitus, pancytopenia in the past, hypertension was brought in by EMS after brother found him not being in his usual cognitive self and had gurgling of speech. CT of the head revealed vasogenic edema in the left temporoparietal lobe with question of underlying mass measuring 3.6.  Neurosurgery was consulted.  Patient also found with COVID-19 positive on admition.  Consultants:   Neurosurgery  Procedures: CT of the head and MRI  Antimicrobials:   None   Subjective: Patient has been converted to comfort care on 10/29/2019.  Palliative care is following the patient.  As per nurse patient is still is confused.  Objective: Vitals:   11/28/19 0829 11/28/19 2037 11/29/19 0311 11/29/19 0931  BP: 139/68 135/69 129/78 100/71  Pulse: 64 65 73 (!) 59  Resp:   17 18  Temp: 97.6 F (36.4 C) 97.9 F (36.6 C) 97.9 F (36.6 C) 97.7 F (36.5 C)  TempSrc: Oral Oral Oral Oral  SpO2: 100% 100% 100% 100%  Weight:   78.1 kg   Height:   6' (1.829 m)     Intake/Output Summary (Last 24 hours) at 11/29/2019 1402 Last data filed at 11/29/2019 0942 Gross per 24 hour  Intake 480 ml  Output 0 ml  Net 480 ml   Filed Weights   11/27/19 0633 11/28/19 0430 11/29/19 0311  Weight: 73.7 kg 77.7 kg 78.1 kg    Examination: From 11/26/2019  General exam: Appears calm and comfortable , but confused Respiratory system: Clear to auscultation. Respiratory effort normal. Cardiovascular system: S1 & S2 heard, RRR. No JVD, murmurs, rubs, gallops or clicks. No pedal edema. Gastrointestinal system: Abdomen is nondistended, soft and nontender. Normal bowel sounds heard. Central  nervous system: awake, confused, no focal neurological deficits. Extremities: no edema Skin: Warm and dry Psychiatry: Mood & affect appropriate in current setting.    Data Reviewed: I have personally reviewed following labs and imaging studies  CBC: Recent Labs  Lab 11/25/19 1513 11/26/19 0710  WBC 5.9 4.4  NEUTROABS 3.9 2.3  HGB 12.9* 11.9*  HCT 37.8* 33.7*  MCV 91.5 88.2  PLT 166 [id number]   Basic Metabolic Panel: Recent Labs  Lab 11/25/19 1513 11/26/19 0710  NA 136 137  K 4.0 3.5  CL 99 102  CO2 24 26  GLUCOSE 106* 109*  BUN 13 12  CREATININE 1.14 1.15  CALCIUM 9.6 9.1   GFR: Estimated Creatinine Clearance: 53.4 mL/min (by C-G formula based on SCr of 1.15 mg/dL). Liver Function Tests: Recent Labs  Lab 11/25/19 1513 11/26/19 0710  AST 18 14*  ALT 13 9  ALKPHOS 50 48  BILITOT 1.2 0.8  PROT 7.2 6.3*  ALBUMIN 3.8 3.3*   No results for input(s): LIPASE, AMYLASE in the last 168 hours. No results for input(s): AMMONIA in the last 168 hours. Coagulation Profile: No results for input(s): INR, PROTIME in the last 168 hours. Cardiac Enzymes: No results for input(s): CKTOTAL, CKMB, CKMBINDEX, TROPONINI in the last 168 hours. BNP (last 3 results) No results for input(s): PROBNP in the last 8760 hours. HbA1C: No results for input(s): HGBA1C in the last 72 hours. CBG: Recent Labs  Lab 11/27/19 1543 11/27/19 2013 11/28/19 0825 11/28/19 1142 11/28/19  2212  GLUCAP 161* 169* 128* 163* 165*   Lipid Profile: No results for input(s): CHOL, HDL, LDLCALC, TRIG, CHOLHDL, LDLDIRECT in the last 72 hours. Thyroid Function Tests: No results for input(s): TSH, T4TOTAL, FREET4, T3FREE, THYROIDAB in the last 72 hours. Anemia Panel: No results for input(s): VITAMINB12, FOLATE, FERRITIN, TIBC, IRON, RETICCTPCT in the last 72 hours. Sepsis Labs: Recent Labs  Lab 11/26/19 0710  PROCALCITON <0.10    Recent Results (from the past 240 hour(s))  SARS CORONAVIRUS 2 (TAT 6-24  HRS) Nasopharyngeal Nasopharyngeal Swab     Status: Abnormal   Collection Time: 11/25/19  3:46 PM   Specimen: Nasopharyngeal Swab  Result Value Ref Range Status   SARS Coronavirus 2 POSITIVE (A) NEGATIVE Final    Comment: RESULT CALLED TO, READ BACK BY AND VERIFIED WITH: Juliane Lack, RN 0518 11/26/19 G.MCADOO (NOTE) SARS-CoV-2 target nucleic acids are DETECTED. The SARS-CoV-2 RNA is generally detectable in upper and lower respiratory specimens during the acute phase of infection. Positive results are indicative of the presence of SARS-CoV-2 RNA. Clinical correlation with patient history and other diagnostic information is  necessary to determine patient infection status. Positive results do not rule out bacterial infection or co-infection with other viruses.  The expected result is Negative. Fact Sheet for Patients: SugarRoll.be Fact Sheet for Healthcare Providers: https://www.woods-mathews.com/ This test is not yet approved or cleared by the [place] FDA and  has been authorized for detection and/or diagnosis of SARS-CoV-2 by FDA under an Emergency Use Authorization (EUA). This EUA will remain  in effect (meaning this test can be used) for th e duration of the COVID-19 declaration under Section 564(b)(1) of the Act, 21 U.S.C. section 360bbb-3(b)(1), unless the authorization is terminated or revoked sooner. Performed at [facility], [place] [address] [place], [place] [address]      Radiology Studies: No results found.  Scheduled Meds: . dexamethasone (DECADRON) injection  4 mg Intravenous Q12H  . sodium chloride flush  3 mL Intravenous Q12H   Continuous Infusions:  Assessment & Plan:   Active Problems:   Type 2 diabetes mellitus with hyperlipidemia (HCC)   Mental status alteration   History of pancytopenia  1.Altered MS-still present  - likely secondary tovasogenic edema in the left temporoparietal lobe with question of  underlying mass measuring 3.6 MRI with Five enhancing brain masses, could be CNS lymphoma or even multi focal high-grade glioma or glioblastoma please see full MRI results masses limited to the left hemisphere. - Per Neurosurgery, Given the location and multifocal nature, there is no role for resection but I did talk with him about possible stereotactic biopsy to gain diagnosis.  We did discuss that the main goal of doing this would be to drop further therapy including chemotherapy and radiation.  He states that his brother would not want to undergo all that treatment at his age and given his other comorbidities and I do think this is reasonable." Continue on Decadron 4 mg IV twice daily for now , may need to transition to po steroid upon d/c (2mg  bid) Neurosurgery's input was appreciated recommended no urgent intervention, after discussion with the family we will hold off on any biopsy Neurochecks Seizure precautions Will consult palliative care, as likely pt will need hospice care.  - Palliative care consulted --patient and patient POA decided for comfort care.  Orders placed in by the palliative care consultant.   2.  Covid 19+: Stable and not needing any supplementary oxygen -No obvious signs of  symptoms so far  -Is on Decadron as mentioned above -Covid precaution   Comfort care:  -Comfort care implemented on 10/29/2019  - palliative care is following.  Appreciate palliative care service input and care  - had a discussion with patient and family/POA with the palliative care consultant. Decided for comfort care Case manager social worker and palliative care nurse to work out on discharge plan where comfort care can be implemented  DVT prophylaxis: SCD Code Status:  Comfort care Family Communication: none at bedside Disposition Plan: Comfort care    LOS: 4 days     Thornell Mule, MD [facility] Pager [phone]  If 7PM-7AM, please contact night-coverage  [email or web address] Password [email or web address] 11/29/2019, 2:02 PM

## 2019-11-29 NOTE — TOC Progression Note (Signed)
Transition of Care [facility]) - Progression Note    Patient Details  Name: Bryan Buck MRN: [id number] Date of Birth: [birth year]  Transition of Care [facility]) CM/SW [place], LCSW Phone Number: 11/29/2019, 2:01 PM  Clinical Narrative: Updated TOC supervisor on this patient. She has forwarded the information to the director of the [facility] department for insight on next steps. Patient unable to go to hospice house with a new COVID diagnosis. Unable to go home to unsafe environment. If we go SNF route, patient/brother would likely have to pay around $8000 per month for room and board.     Expected Discharge Plan: (Unsure) Barriers to Discharge: Continued Medical Work up  Expected Discharge Plan and Services Expected Discharge Plan: (Unsure)       Living arrangements for the past 2 months: Single Family Home                                       Social Determinants of Health (SDOH) Interventions    Readmission Risk Interventions No flowsheet data found.

## 2019-11-29 NOTE — Progress Notes (Addendum)
Palliative Note:  Chart reviewed. Updates received from RN. Patient is awake, alert. Continues to require Air cabin crew. Remains confused and adamant about returning home. RN reports patient is pleasant, however is extremely compulsive and will get up and try to do things. PRN ativan available and being administered as needed.   Patient is COVID +, no family visitation.   Spoke with brother and plans remain for placement and hospice support. He understands barriers to placement and possibility of DSS involvement for safety concerns.   Patient's prognosis is poor with expectancy of weeks-months. Given sudden changes and vasogenic edema with shift patient could rapidly decline at any time resulting in death or worsening symptoms (confusion, lethargy). He is full comfort and is appropriate for hospice, however unable to meet criteria for hospice home due to COVID +, ambulatory, requiring safety sitter, and still with some ability to care for self and po nutrition. If patient further declines he may become most appropriate for hospice home with a prognosis of 2 weeks or less.   Plan -continue with comfort measures -CSW working on best/appropriate discharge plan. Patient is not hospice home eligible currently although status may change given he is now full comfort with no aggressive interventions or work-up regarding brain tumor. Patient can not return to his home safely due to unsafe living environment and medical condition.  -d/c IV decadron and convert to po decadron 2mg  bid for symptom management -PMT will continue to support and follow as needed. Please call. No weekend coverage.   Total Time: 20 min.   Greater than 50%  of this time was spent counseling and coordinating care related to the above assessment and plan.  Alda Lea, AGPCNP-BC Palliative Medicine Team   The above conversation was completed via telephone due to the visitor restrictions during the COVID-19 pandemic.  Thorough chart review and discussion with necessary members of the care team was completed as part of assessment. All issues were discussed and addressed but no physical exam was performed.

## 2019-11-30 LAB — GLUCOSE, CAPILLARY
Glucose-Capillary: 95 mg/dL (ref 70–99)
Glucose-Capillary: 144 mg/dL — ABNORMAL HIGH (ref 70–99)

## 2019-11-30 NOTE — Progress Notes (Signed)
PROGRESS NOTE    Bryan Buck  [id number] DOB: Sep 05, 1934 DOA: 11/25/2019 PCP: Kirk Ruths, MD    Brief Narrative:  Bryan Buck is a 85 y.o. male with medical history significant of cognitive decline, questionable Alzheimer's disease, COPD, diabetes mellitus type 2, pernicious anemia, hyperlipidemia, type 2 diabetes mellitus, pancytopenia in the past, hypertension was brought in by EMS after brother found him not being in his usual cognitive self and had gurgling of speech. CT of the head revealed vasogenic edema in the left temporoparietal lobe with question of underlying mass measuring 3.6.  Neurosurgery was consulted.  Patient also found with COVID-19 positive on admition.  Consultants:   Neurosurgery  Procedures: CT of the head and MRI  Antimicrobials:   None   Subjective: Patient has been converted to comfort care on 10/29/2019.  Palliative care is following the patient.  As per nurse patient is still is confused.  No acute issue as per nurses.  Objective: Vitals:   11/29/19 0931 11/29/19 2201 11/30/19 0347 11/30/19 0836  BP: 100/71 (!) 143/67 136/72 (!) 136/59  Pulse: (!) 59 (!) 56 (!) 54 (!) 53  Resp: 18 18 20    Temp: 97.7 F (36.5 C) 98.6 F (37 C) 97.6 F (36.4 C) 97.6 F (36.4 C)  TempSrc: Oral Oral Oral Oral  SpO2: 100% 97% 97% 100%  Weight:      Height:        Intake/Output Summary (Last 24 hours) at 11/30/2019 1314 Last data filed at 11/29/2019 1806 Gross per 24 hour  Intake 480 ml  Output -  Net 480 ml   Filed Weights   11/27/19 0633 11/28/19 0430 11/29/19 0311  Weight: 73.7 kg 77.7 kg 78.1 kg    Examination: From 11/26/2019  General exam: Appears calm and comfortable , but confused Respiratory system: Clear to auscultation. Respiratory effort normal. Cardiovascular system: S1 & S2 heard, RRR. No JVD, murmurs, rubs, gallops or clicks. No pedal edema. Gastrointestinal system: Abdomen is nondistended, soft and nontender. Normal  bowel sounds heard. Central nervous system: awake, confused, no focal neurological deficits. Extremities: no edema Skin: Warm and dry Psychiatry: Mood & affect appropriate in current setting.    Data Reviewed: I have personally reviewed following labs and imaging studies  CBC: Recent Labs  Lab 11/25/19 1513 11/26/19 0710  WBC 5.9 4.4  NEUTROABS 3.9 2.3  HGB 12.9* 11.9*  HCT 37.8* 33.7*  MCV 91.5 88.2  PLT 166 [id number]   Basic Metabolic Panel: Recent Labs  Lab 11/25/19 1513 11/26/19 0710  NA 136 137  K 4.0 3.5  CL 99 102  CO2 24 26  GLUCOSE 106* 109*  BUN 13 12  CREATININE 1.14 1.15  CALCIUM 9.6 9.1   GFR: Estimated Creatinine Clearance: 53.4 mL/min (by C-G formula based on SCr of 1.15 mg/dL). Liver Function Tests: Recent Labs  Lab 11/25/19 1513 11/26/19 0710  AST 18 14*  ALT 13 9  ALKPHOS 50 48  BILITOT 1.2 0.8  PROT 7.2 6.3*  ALBUMIN 3.8 3.3*   No results for input(s): LIPASE, AMYLASE in the last 168 hours. No results for input(s): AMMONIA in the last 168 hours. Coagulation Profile: No results for input(s): INR, PROTIME in the last 168 hours. Cardiac Enzymes: No results for input(s): CKTOTAL, CKMB, CKMBINDEX, TROPONINI in the last 168 hours. BNP (last 3 results) No results for input(s): PROBNP in the last 8760 hours. HbA1C: No results for input(s): HGBA1C in the last 72 hours. CBG: Recent Labs  Lab 11/28/19  0825 11/28/19 1142 11/28/19 2212 11/30/19 0837 11/30/19 1154  GLUCAP 128* 163* 165* 95 144*   Lipid Profile: No results for input(s): CHOL, HDL, LDLCALC, TRIG, CHOLHDL, LDLDIRECT in the last 72 hours. Thyroid Function Tests: No results for input(s): TSH, T4TOTAL, FREET4, T3FREE, THYROIDAB in the last 72 hours. Anemia Panel: No results for input(s): VITAMINB12, FOLATE, FERRITIN, TIBC, IRON, RETICCTPCT in the last 72 hours. Sepsis Labs: Recent Labs  Lab 11/26/19 0710  PROCALCITON <0.10    Recent Results (from the past 240 hour(s))  SARS  CORONAVIRUS 2 (TAT 6-24 HRS) Nasopharyngeal Nasopharyngeal Swab     Status: Abnormal   Collection Time: 11/25/19  3:46 PM   Specimen: Nasopharyngeal Swab  Result Value Ref Range Status   SARS Coronavirus 2 POSITIVE (A) NEGATIVE Final    Comment: RESULT CALLED TO, READ BACK BY AND VERIFIED WITH: Juliane Lack, RN 0518 11/26/19 G.MCADOO (NOTE) SARS-CoV-2 target nucleic acids are DETECTED. The SARS-CoV-2 RNA is generally detectable in upper and lower respiratory specimens during the acute phase of infection. Positive results are indicative of the presence of SARS-CoV-2 RNA. Clinical correlation with patient history and other diagnostic information is  necessary to determine patient infection status. Positive results do not rule out bacterial infection or co-infection with other viruses.  The expected result is Negative. Fact Sheet for Patients: SugarRoll.be Fact Sheet for Healthcare Providers: https://www.woods-mathews.com/ This test is not yet approved or cleared by the [place] FDA and  has been authorized for detection and/or diagnosis of SARS-CoV-2 by FDA under an Emergency Use Authorization (EUA). This EUA will remain  in effect (meaning this test can be used) for th e duration of the COVID-19 declaration under Section 564(b)(1) of the Act, 21 U.S.C. section 360bbb-3(b)(1), unless the authorization is terminated or revoked sooner. Performed at [facility], [place] [address], [place], [place] [address]      Radiology Studies: No results found.  Scheduled Meds: . dexamethasone  2 mg Oral BID  . sodium chloride flush  3 mL Intravenous Q12H   Continuous Infusions:  Assessment & Plan:   Active Problems:   Type 2 diabetes mellitus with hyperlipidemia (HCC)   Mental status alteration   History of pancytopenia  1.Altered MS-still present  - likely secondary tovasogenic edema in the left temporoparietal lobe with question of  underlying mass measuring 3.6 MRI with Five enhancing brain masses, could be CNS lymphoma or even multi focal high-grade glioma or glioblastoma please see full MRI results masses limited to the left hemisphere. - Per Neurosurgery, Given the location and multifocal nature, there is no role for resection but I did talk with him about possible stereotactic biopsy to gain diagnosis.  We did discuss that the main goal of doing this would be to drop further therapy including chemotherapy and radiation.  He states that his brother would not want to undergo all that treatment at his age and given his other comorbidities and I do think this is reasonable." Continue on Decadron 4 mg IV twice daily for now , may need to transition to po steroid upon d/c (2mg  bid) Neurosurgery's input was appreciated recommended no urgent intervention, after discussion with the family we will hold off on any biopsy Neurochecks Seizure precautions Will consult palliative care, as likely pt will need hospice care.  - Palliative care consulted --patient and patient POA decided for comfort care.  Orders placed in by the palliative care consultant.   2.  Covid 19+: Stable and not needing any  supplementary oxygen -No obvious signs of symptoms so far  -Is on Decadron as mentioned above -Covid precaution   Comfort care: Continue comfort care -Comfort care implemented on 10/29/2019  - palliative care is following.  Appreciate palliative care service input and care  - had a discussion with patient and family/POA with the palliative care consultant. Decided for comfort care Case manager social worker and palliative care nurse to work out on discharge plan where comfort care can be implemented  DVT prophylaxis: SCD Code Status:  Comfort care Family Communication: none at bedside Disposition Plan: Comfort care    LOS: 5 days     Thornell Mule, MD [facility] Pager [phone]  If 7PM-7AM, please contact  night-coverage [email or web address] Password [email or web address] 11/30/2019, 1:14 PM

## 2019-11-30 NOTE — Plan of Care (Signed)
  Problem: Safety: Goal: Ability to remain free from injury will improve Outcome: Progressing   

## 2019-12-01 NOTE — Progress Notes (Signed)
PROGRESS NOTE    Bryan Buck  [id number] DOB: 1934-10-19 DOA: 11/25/2019 PCP: Kirk Ruths, MD    Brief Narrative:  Bryan Buck is a 85 y.o. male with medical history significant of cognitive decline, questionable Alzheimer's disease, COPD, diabetes mellitus type 2, pernicious anemia, hyperlipidemia, type 2 diabetes mellitus, pancytopenia in the past, hypertension was brought in by EMS after brother found him not being in his usual cognitive self and had gurgling of speech. CT of the head revealed vasogenic edema in the left temporoparietal lobe with question of underlying mass measuring 3.6.  Neurosurgery was consulted.  Patient also found with COVID-19 positive on admition.  Consultants:   Neurosurgery  Procedures: CT of the head and MRI  Antimicrobials:   None   Subjective: No acute issues as per nurses.  Patient has been converted to comfort care on 10/29/2019.  Palliative care is following the patient.     Objective: Vitals:   11/30/19 0836 11/30/19 1623 11/30/19 2016 12/01/19 0903  BP: (!) 136/59 (!) 128/52 125/64 (!) 150/55  Pulse: (!) 53 72 73 (!) 52  Resp:  16 17   Temp: 97.6 F (36.4 C) 97.7 F (36.5 C) 98.9 F (37.2 C) (!) 97.5 F (36.4 C)  TempSrc: Oral Oral Oral Oral  SpO2: 100% 100% 98% 98%  Weight:      Height:       No intake or output data in the 24 hours ending 12/01/19 1243 Filed Weights   11/27/19 0633 11/28/19 0430 11/29/19 0311  Weight: 73.7 kg 77.7 kg 78.1 kg    Examination: From 11/26/2019  General exam: Appears calm and comfortable , but confused Respiratory system: Clear to auscultation. Respiratory effort normal. Cardiovascular system: S1 & S2 heard, RRR. No JVD, murmurs, rubs, gallops or clicks. No pedal edema. Gastrointestinal system: Abdomen is nondistended, soft and nontender. Normal bowel sounds heard. Central nervous system: awake, confused, no focal neurological deficits. Extremities: no edema Skin: Warm and  dry Psychiatry: Mood & affect appropriate in current setting.    Data Reviewed: I have personally reviewed following labs and imaging studies  CBC: Recent Labs  Lab 11/25/19 1513 11/26/19 0710  WBC 5.9 4.4  NEUTROABS 3.9 2.3  HGB 12.9* 11.9*  HCT 37.8* 33.7*  MCV 91.5 88.2  PLT 166 [id number]   Basic Metabolic Panel: Recent Labs  Lab 11/25/19 1513 11/26/19 0710  NA 136 137  K 4.0 3.5  CL 99 102  CO2 24 26  GLUCOSE 106* 109*  BUN 13 12  CREATININE 1.14 1.15  CALCIUM 9.6 9.1   GFR: Estimated Creatinine Clearance: 53.4 mL/min (by C-G formula based on SCr of 1.15 mg/dL). Liver Function Tests: Recent Labs  Lab 11/25/19 1513 11/26/19 0710  AST 18 14*  ALT 13 9  ALKPHOS 50 48  BILITOT 1.2 0.8  PROT 7.2 6.3*  ALBUMIN 3.8 3.3*   No results for input(s): LIPASE, AMYLASE in the last 168 hours. No results for input(s): AMMONIA in the last 168 hours. Coagulation Profile: No results for input(s): INR, PROTIME in the last 168 hours. Cardiac Enzymes: No results for input(s): CKTOTAL, CKMB, CKMBINDEX, TROPONINI in the last 168 hours. BNP (last 3 results) No results for input(s): PROBNP in the last 8760 hours. HbA1C: No results for input(s): HGBA1C in the last 72 hours. CBG: Recent Labs  Lab 11/28/19 0825 11/28/19 1142 11/28/19 2212 11/30/19 0837 11/30/19 1154  GLUCAP 128* 163* 165* 95 144*   Lipid Profile: No results for input(s): CHOL, HDL,  LDLCALC, TRIG, CHOLHDL, LDLDIRECT in the last 72 hours. Thyroid Function Tests: No results for input(s): TSH, T4TOTAL, FREET4, T3FREE, THYROIDAB in the last 72 hours. Anemia Panel: No results for input(s): VITAMINB12, FOLATE, FERRITIN, TIBC, IRON, RETICCTPCT in the last 72 hours. Sepsis Labs: Recent Labs  Lab 11/26/19 0710  PROCALCITON <0.10    Recent Results (from the past 240 hour(s))  SARS CORONAVIRUS 2 (TAT 6-24 HRS) Nasopharyngeal Nasopharyngeal Swab     Status: Abnormal   Collection Time: 11/25/19  3:46 PM    Specimen: Nasopharyngeal Swab  Result Value Ref Range Status   SARS Coronavirus 2 POSITIVE (A) NEGATIVE Final    Comment: RESULT CALLED TO, READ BACK BY AND VERIFIED WITH: Juliane Lack, RN 0518 11/26/19 G.MCADOO (NOTE) SARS-CoV-2 target nucleic acids are DETECTED. The SARS-CoV-2 RNA is generally detectable in upper and lower respiratory specimens during the acute phase of infection. Positive results are indicative of the presence of SARS-CoV-2 RNA. Clinical correlation with patient history and other diagnostic information is  necessary to determine patient infection status. Positive results do not rule out bacterial infection or co-infection with other viruses.  The expected result is Negative. Fact Sheet for Patients: SugarRoll.be Fact Sheet for Healthcare Providers: https://www.woods-mathews.com/ This test is not yet approved or cleared by the [place] FDA and  has been authorized for detection and/or diagnosis of SARS-CoV-2 by FDA under an Emergency Use Authorization (EUA). This EUA will remain  in effect (meaning this test can be used) for th e duration of the COVID-19 declaration under Section 564(b)(1) of the Act, 21 U.S.C. section 360bbb-3(b)(1), unless the authorization is terminated or revoked sooner. Performed at [facility], [place] [address] [place], [place] [address]      Radiology Studies: No results found.  Scheduled Meds:  dexamethasone  2 mg Oral BID   sodium chloride flush  3 mL Intravenous Q12H   Continuous Infusions:  Assessment & Plan:   Active Problems:   Type 2 diabetes mellitus with hyperlipidemia (HCC)   Mental status alteration   History of pancytopenia  1.Altered MS-still present  - likely secondary tovasogenic edema in the left temporoparietal lobe with question of underlying mass measuring 3.6 MRI with Five enhancing brain masses, could be CNS lymphoma or even multi focal high-grade glioma  or glioblastoma please see full MRI results masses limited to the left hemisphere. - Per Neurosurgery, Given the location and multifocal nature, there is no role for resection but I did talk with him about possible stereotactic biopsy to gain diagnosis.  We did discuss that the main goal of doing this would be to drop further therapy including chemotherapy and radiation.  He states that his brother would not want to undergo all that treatment at his age and given his other comorbidities and I do think this is reasonable." Continue on Decadron 4 mg IV twice daily for now , may need to transition to po steroid upon d/c (2mg  bid) Neurosurgery's input was appreciated recommended no urgent intervention, after discussion with the family we will hold off on any biopsy Neurochecks Seizure precautions Will consult palliative care, as likely pt will need hospice care.  - Palliative care consulted --patient and patient POA decided for comfort care.  Orders placed in by the palliative care consultant.   2.  Covid 19+: Stable and not needing any supplementary oxygen -No obvious signs of symptoms so far  -Is on Decadron as mentioned above -Covid precaution   Comfort care: Continue comfort care -Comfort  care implemented on 10/29/2019  - palliative care is following.  Appreciate palliative care service input and care  - had a discussion with patient and family/POA with the palliative care consultant. Decided for comfort care Case manager social worker and palliative care nurse to work out on discharge plan where comfort care can be implemented Stable disposition depending on hospice arrangement  DVT prophylaxis: SCD Code Status:  Comfort care Family Communication: none at bedside Disposition Plan: Comfort care    LOS: 6 days     Thornell Mule, MD [facility] Pager [phone]  If 7PM-7AM, please contact night-coverage [email or web address] Password [facility] 12/01/2019, 12:43 PM

## 2019-12-01 NOTE — Plan of Care (Signed)
  Problem: Education: Goal: Knowledge of General Education information will improve Description: Including pain rating scale, medication(s)/side effects and non-pharmacologic comfort measures Outcome: Progressing   Problem: Health Behavior/Discharge Planning: Goal: Ability to manage health-related needs will improve Outcome: Not Progressing Note: Patient is comfort care only with unsafe home environment, and cannot go to either a SNF or home w/ hospice. Palliative care following. Will continue to monitor overall progression for the remainder of the shift. Wenda Low [facility]

## 2019-12-02 DIAGNOSIS — Z515 Encounter for palliative care: Secondary | ICD-10-CM

## 2019-12-02 MED ORDER — MAGNESIUM HYDROXIDE 400 MG/5ML PO SUSP
15.0000 mL | Freq: Every day | ORAL | Status: DC | PRN
  Administered 2019-12-02 – 2019-12-08 (×3): 15 mL via ORAL
  Filled 2019-12-02 (×4): qty 30

## 2019-12-02 MED ORDER — POLYETHYLENE GLYCOL 3350 17 G PO PACK
17.0000 g | PACK | Freq: Every day | ORAL | Status: DC | PRN
  Administered 2019-12-09 – 2019-12-12 (×2): 17 g via ORAL
  Filled 2019-12-02 (×2): qty 1

## 2019-12-02 NOTE — Care Management Important Message (Signed)
Important Message  Patient Details  Name: Raheem Balling MRN: [id number] Date of Birth: [birth year]   Medicare Important Message Given:  Yes   Reviewed over phone with Jr Kovacich, brother, at [phone].  Aware of right.  Copy of Medicare IM sent to brother's address: [address] [place], [place] [address]   Madyx Delfin 12/02/2019, 11:18 AM
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
## 2019-12-02 NOTE — Plan of Care (Signed)
  Problem: Education: Goal: Knowledge of General Education information will improve Description: Including pain rating scale, medication(s)/side effects and non-pharmacologic comfort measures Outcome: Progressing   Problem: Health Behavior/Discharge Planning: Goal: Ability to manage health-related needs will improve Outcome: Progressing   Problem: Clinical Measurements: Goal: Ability to maintain clinical measurements within normal limits will improve Outcome: Progressing Goal: Will remain free from infection Outcome: Progressing Goal: Diagnostic test results will improve Outcome: Progressing Goal: Respiratory complications will improve Outcome: Progressing Goal: Cardiovascular complication will be avoided Outcome: Progressing   Problem: Activity: Goal: Risk for activity intolerance will decrease Outcome: Progressing   Problem: Nutrition: Goal: Adequate nutrition will be maintained Outcome: Progressing   Problem: Coping: Goal: Level of anxiety will decrease Outcome: Progressing   Problem: Elimination: Goal: Will not experience complications related to bowel motility Outcome: Progressing Goal: Will not experience complications related to urinary retention Outcome: Progressing   Problem: Pain Managment: Goal: General experience of comfort will improve Outcome: Progressing   Problem: Safety: Goal: Ability to remain free from injury will improve Outcome: Progressing   Problem: Skin Integrity: Goal: Risk for impaired skin integrity will decrease Outcome: Progressing   Problem: Education: Goal: Knowledge of the prescribed therapeutic regimen will improve Outcome: Progressing   Problem: Coping: Goal: Ability to identify and develop effective coping behavior will improve Outcome: Progressing   Problem: Clinical Measurements: Goal: Quality of life will improve Outcome: Progressing   Problem: Respiratory: Goal: Verbalizations of increased ease of respirations will  increase Outcome: Progressing   Problem: Role Relationship: Goal: Family's ability to cope with current situation will improve Outcome: Progressing Goal: Ability to verbalize concerns, feelings, and thoughts to partner or family member will improve Outcome: Progressing   Problem: Pain Management: Goal: Satisfaction with pain management regimen will improve Outcome: Progressing   Problem: Education: Goal: Knowledge of risk factors and measures for prevention of condition will improve Outcome: Progressing   Problem: Coping: Goal: Psychosocial and spiritual needs will be supported Outcome: Progressing   Problem: Respiratory: Goal: Will maintain a patent airway Outcome: Progressing Goal: Complications related to the disease process, condition or treatment will be avoided or minimized Outcome: Progressing

## 2019-12-02 NOTE — Progress Notes (Signed)
PROGRESS NOTE    Bryan Buck  [id number] DOB: 04/06/1934 DOA: 11/25/2019 PCP: Kirk Ruths, MD    Brief Narrative:  Bryan Buck is a 85 y.o. male with medical history significant of cognitive decline, questionable Alzheimer's disease, COPD, diabetes mellitus type 2, pernicious anemia, hyperlipidemia, type 2 diabetes mellitus, pancytopenia in the past, hypertension was brought in by EMS after brother found him not being in his usual cognitive self and had gurgling of speech. CT of the head revealed vasogenic edema in the left temporoparietal lobe with question of underlying mass measuring 3.6.  Neurosurgery was consulted.  Patient also found with COVID-19 positive on admition.  Consultants:   Neurosurgery  Procedures: CT of the head and MRI  Antimicrobials:   None   Subjective: No acute issues as per nurses.  Patient has been converted to comfort care on 10/29/2019.  Palliative care is following the patient.  Awaiting discharge.     Objective: Vitals:   11/30/19 2016 12/01/19 0903 12/01/19 1926 12/02/19 0807  BP: 125/64 (!) 150/55 131/66 130/66  Pulse: 73 (!) 52 67 (!) 52  Resp: 17  19 16   Temp: 98.9 F (37.2 C) (!) 97.5 F (36.4 C) 97.8 F (36.6 C) (!) 97.3 F (36.3 C)  TempSrc: Oral Oral Oral Oral  SpO2: 98% 98% 98% 96%  Weight:      Height:       No intake or output data in the 24 hours ending 12/02/19 1203 Filed Weights   11/27/19 [id number] 11/28/19 0430 11/29/19 0311  Weight: 73.7 kg 77.7 kg 78.1 kg    Examination: From 11/26/2019  General exam: Appears calm and comfortable , but confused Respiratory system: Clear to auscultation. Respiratory effort normal. Cardiovascular system: S1 & S2 heard, RRR. No JVD, murmurs, rubs, gallops or clicks. No pedal edema. Gastrointestinal system: Abdomen is nondistended, soft and nontender. Normal bowel sounds heard. Central nervous system: awake, confused, no focal neurological deficits. Extremities: no edema  Skin: Warm and dry Psychiatry: Mood & affect appropriate in current setting.    Data Reviewed: I have personally reviewed following labs and imaging studies  CBC: Recent Labs  Lab 11/25/19 1513 11/26/19 0710  WBC 5.9 4.4  NEUTROABS 3.9 2.3  HGB 12.9* 11.9*  HCT 37.8* 33.7*  MCV 91.5 88.2  PLT 166 [id number]   Basic Metabolic Panel: Recent Labs  Lab 11/25/19 1513 11/26/19 0710  NA 136 137  K 4.0 3.5  CL 99 102  CO2 24 26  GLUCOSE 106* 109*  BUN 13 12  CREATININE 1.14 1.15  CALCIUM 9.6 9.1   GFR: Estimated Creatinine Clearance: 53.4 mL/min (by C-G formula based on SCr of 1.15 mg/dL). Liver Function Tests: Recent Labs  Lab 11/25/19 1513 11/26/19 0710  AST 18 14*  ALT 13 9  ALKPHOS 50 48  BILITOT 1.2 0.8  PROT 7.2 6.3*  ALBUMIN 3.8 3.3*   No results for input(s): LIPASE, AMYLASE in the last 168 hours. No results for input(s): AMMONIA in the last 168 hours. Coagulation Profile: No results for input(s): INR, PROTIME in the last 168 hours. Cardiac Enzymes: No results for input(s): CKTOTAL, CKMB, CKMBINDEX, TROPONINI in the last 168 hours. BNP (last 3 results) No results for input(s): PROBNP in the last 8760 hours. HbA1C: No results for input(s): HGBA1C in the last 72 hours. CBG: Recent Labs  Lab 11/28/19 0825 11/28/19 1142 11/28/19 2212 11/30/19 0837 11/30/19 1154  GLUCAP 128* 163* 165* 95 144*   Lipid Profile: No results for input(s):  CHOL, HDL, LDLCALC, TRIG, CHOLHDL, LDLDIRECT in the last 72 hours. Thyroid Function Tests: No results for input(s): TSH, T4TOTAL, FREET4, T3FREE, THYROIDAB in the last 72 hours. Anemia Panel: No results for input(s): VITAMINB12, FOLATE, FERRITIN, TIBC, IRON, RETICCTPCT in the last 72 hours. Sepsis Labs: Recent Labs  Lab 11/26/19 0710  PROCALCITON <0.10    Recent Results (from the past 240 hour(s))  SARS CORONAVIRUS 2 (TAT 6-24 HRS) Nasopharyngeal Nasopharyngeal Swab     Status: Abnormal   Collection Time: 11/25/19   3:46 PM   Specimen: Nasopharyngeal Swab  Result Value Ref Range Status   SARS Coronavirus 2 POSITIVE (A) NEGATIVE Final    Comment: RESULT CALLED TO, READ BACK BY AND VERIFIED WITH: Juliane Lack, RN 0518 11/26/19 G.MCADOO (NOTE) SARS-CoV-2 target nucleic acids are DETECTED. The SARS-CoV-2 RNA is generally detectable in upper and lower respiratory specimens during the acute phase of infection. Positive results are indicative of the presence of SARS-CoV-2 RNA. Clinical correlation with patient history and other diagnostic information is  necessary to determine patient infection status. Positive results do not rule out bacterial infection or co-infection with other viruses.  The expected result is Negative. Fact Sheet for Patients: SugarRoll.be Fact Sheet for Healthcare Providers: https://www.woods-mathews.com/ This test is not yet approved or cleared by the [place] FDA and  has been authorized for detection and/or diagnosis of SARS-CoV-2 by FDA under an Emergency Use Authorization (EUA). This EUA will remain  in effect (meaning this test can be used) for th e duration of the COVID-19 declaration under Section 564(b)(1) of the Act, 21 U.S.C. section 360bbb-3(b)(1), unless the authorization is terminated or revoked sooner. Performed at [facility], [place] [address], [place], [place] [address]      Radiology Studies: No results found.  Scheduled Meds: . dexamethasone  2 mg Oral BID  . sodium chloride flush  3 mL Intravenous Q12H   Continuous Infusions:  Assessment & Plan:   Active Problems:   Type 2 diabetes mellitus with hyperlipidemia (HCC)   Mental status alteration   History of pancytopenia  1.Altered MS-still present  - likely secondary tovasogenic edema in the left temporoparietal lobe with question of underlying mass measuring 3.6 MRI with Five enhancing brain masses, could be CNS lymphoma or even multi focal  high-grade glioma or glioblastoma please see full MRI results masses limited to the left hemisphere. - Per Neurosurgery, Given the location and multifocal nature, there is no role for resection but I did talk with him about possible stereotactic biopsy to gain diagnosis.  We did discuss that the main goal of doing this would be to drop further therapy including chemotherapy and radiation.  He states that his brother would not want to undergo all that treatment at his age and given his other comorbidities and I do think this is reasonable." Continue on Decadron 4 mg IV twice daily for now , may need to transition to po steroid upon d/c (2mg  bid) Neurosurgery's input was appreciated recommended no urgent intervention, after discussion with the family we will hold off on any biopsy Neurochecks Seizure precautions Will consult palliative care, as likely pt will need hospice care.  - Palliative care consulted --patient and patient POA decided for comfort care.  Orders placed in by the palliative care consultant.   2.  Covid 19+: Stable and not needing any supplementary oxygen -No obvious signs of symptoms so far  -Is on Decadron as mentioned above -Covid precaution   Comfort care: Continue comfort  care -Comfort care implemented on 10/29/2019  - palliative care is following.  Appreciate palliative care service input and care  - had a discussion with patient and family/POA with the palliative care consultant. Decided for comfort care Case manager social worker and palliative care nurse to work out on discharge plan where comfort care can be implemented Stable disposition depending on hospice arrangement  DVT prophylaxis: SCD Code Status:  Comfort care Family Communication: none at bedside Disposition Plan: Comfort care    LOS: 7 days     Thornell Mule, MD [facility] Pager [phone]  If 7PM-7AM, please contact night-coverage [email or web address] Password [email or web address] 12/02/2019, 12:03 PM

## 2019-12-02 NOTE — Plan of Care (Signed)
  Problem: Safety: Goal: Ability to remain free from injury will improve Outcome: Progressing   

## 2019-12-03 NOTE — TOC Progression Note (Addendum)
Transition of Care [facility]) - Progression Note    Patient Details  Name: Bryan Buck MRN: [id number] Date of Birth: [birth year]  Transition of Care [facility]) CM/SW [place], LCSW Phone Number: 12/03/2019, 12:52 PM  Clinical Narrative: CSW called brother to discuss nursing facility with hospice. Brother is agreeable. No facility preference. He is aware patient will have to pay out of pocket for room and board. He will go to [employer] today to check on patient's financials. Patient owns land, his home, and multiple rental properties.   1:26 pm: [facility] does not allow hospice care. Patients only typically stay 2 weeks.  2:53 pm: [facility] declined.  Expected Discharge Plan: (Unsure) Barriers to Discharge: Continued Medical Work up  Expected Discharge Plan and Services Expected Discharge Plan: (Unsure)       Living arrangements for the past 2 months: Single Family Home                                       Social Determinants of Health (SDOH) Interventions    Readmission Risk Interventions No flowsheet data found.

## 2019-12-03 NOTE — Progress Notes (Signed)
PROGRESS NOTE    Bryan Buck  [id number] DOB: 10-Jun-1934 DOA: 11/25/2019 PCP: Kirk Ruths, MD    Brief Narrative:  Bryan Buck is a 85 y.o. male with medical history significant of cognitive decline, questionable Alzheimer's disease, COPD, diabetes mellitus type 2, pernicious anemia, hyperlipidemia, type 2 diabetes mellitus, pancytopenia in the past, hypertension was brought in by EMS after brother found him not being in his usual cognitive self and had gurgling of speech. CT of the head revealed vasogenic edema in the left temporoparietal lobe with question of underlying mass measuring 3.6.  Neurosurgery was consulted.  Patient also found with COVID-19 positive on admition.  Consultants:   Neurosurgery  Procedures: CT of the head and MRI  Antimicrobials:   None   Subjective: Palliative care is following the patient.  Awaiting discharge.     Objective: Vitals:   12/01/19 1926 12/02/19 0807 12/02/19 2005 12/03/19 0802  BP: 131/66 130/66 133/62 136/69  Pulse: 67 (!) 52 (!) 59 (!) 52  Resp: 19 16 20 16   Temp: 97.8 F (36.6 C) (!) 97.3 F (36.3 C) 97.8 F (36.6 C) 97.6 F (36.4 C)  TempSrc: Oral Oral Oral Oral  SpO2: 98% 96% 96% 99%  Weight:      Height:        Intake/Output Summary (Last 24 hours) at 12/03/2019 1227 Last data filed at 12/02/2019 1714 Gross per 24 hour  Intake 240 ml  Output 1 ml  Net 239 ml   Filed Weights   11/27/19 0633 11/28/19 0430 11/29/19 0311  Weight: 73.7 kg 77.7 kg 78.1 kg    Examination: From 11/26/2019  General exam: Appears calm and comfortable , but confused Respiratory system: Clear to auscultation. Respiratory effort normal. Cardiovascular system: S1 & S2 heard, RRR. No JVD, murmurs, rubs, gallops or clicks. No pedal edema. Gastrointestinal system: Abdomen is nondistended, soft and nontender. Normal bowel sounds heard. Central nervous system: awake, confused, no focal neurological deficits. Extremities: no  edema Skin: Warm and dry Psychiatry: Mood & affect appropriate in current setting.    Data Reviewed: I have personally reviewed following labs and imaging studies  CBC: No results for input(s): WBC, NEUTROABS, HGB, HCT, MCV, PLT in the last 168 hours. Basic Metabolic Panel: No results for input(s): NA, K, CL, CO2, GLUCOSE, BUN, CREATININE, CALCIUM, MG, PHOS in the last 168 hours. GFR: Estimated Creatinine Clearance: 53.4 mL/min (by C-G formula based on SCr of 1.15 mg/dL). Liver Function Tests: No results for input(s): AST, ALT, ALKPHOS, BILITOT, PROT, ALBUMIN in the last 168 hours. No results for input(s): LIPASE, AMYLASE in the last 168 hours. No results for input(s): AMMONIA in the last 168 hours. Coagulation Profile: No results for input(s): INR, PROTIME in the last 168 hours. Cardiac Enzymes: No results for input(s): CKTOTAL, CKMB, CKMBINDEX, TROPONINI in the last 168 hours. BNP (last 3 results) No results for input(s): PROBNP in the last 8760 hours. HbA1C: No results for input(s): HGBA1C in the last 72 hours. CBG: Recent Labs  Lab 11/28/19 0825 11/28/19 1142 11/28/19 2212 11/30/19 0837 11/30/19 1154  GLUCAP 128* 163* 165* 95 144*   Lipid Profile: No results for input(s): CHOL, HDL, LDLCALC, TRIG, CHOLHDL, LDLDIRECT in the last 72 hours. Thyroid Function Tests: No results for input(s): TSH, T4TOTAL, FREET4, T3FREE, THYROIDAB in the last 72 hours. Anemia Panel: No results for input(s): VITAMINB12, FOLATE, FERRITIN, TIBC, IRON, RETICCTPCT in the last 72 hours. Sepsis Labs: No results for input(s): PROCALCITON, LATICACIDVEN in the last 168 hours.  Recent Results (from the past 240 hour(s))  SARS CORONAVIRUS 2 (TAT 6-24 HRS) Nasopharyngeal Nasopharyngeal Swab     Status: Abnormal   Collection Time: 11/25/19  3:46 PM   Specimen: Nasopharyngeal Swab  Result Value Ref Range Status   SARS Coronavirus 2 POSITIVE (A) NEGATIVE Final    Comment: RESULT CALLED TO, READ BACK  BY AND VERIFIED WITH: Juliane Lack, RN 0518 11/26/19 G.MCADOO (NOTE) SARS-CoV-2 target nucleic acids are DETECTED. The SARS-CoV-2 RNA is generally detectable in upper and lower respiratory specimens during the acute phase of infection. Positive results are indicative of the presence of SARS-CoV-2 RNA. Clinical correlation with patient history and other diagnostic information is  necessary to determine patient infection status. Positive results do not rule out bacterial infection or co-infection with other viruses.  The expected result is Negative. Fact Sheet for Patients: SugarRoll.be Fact Sheet for Healthcare Providers: https://www.woods-mathews.com/ This test is not yet approved or cleared by the [place] FDA and  has been authorized for detection and/or diagnosis of SARS-CoV-2 by FDA under an Emergency Use Authorization (EUA). This EUA will remain  in effect (meaning this test can be used) for th e duration of the COVID-19 declaration under Section 564(b)(1) of the Act, 21 U.S.C. section 360bbb-3(b)(1), unless the authorization is terminated or revoked sooner. Performed at [facility], [place] [address], [place], [place] [address]      Radiology Studies: No results found.  Scheduled Meds: . dexamethasone  2 mg Oral BID  . sodium chloride flush  3 mL Intravenous Q12H   Continuous Infusions:  Assessment & Plan:   Active Problems:   Type 2 diabetes mellitus with hyperlipidemia (HCC)   Mental status alteration   History of pancytopenia  1.Altered MS-still present  - likely secondary tovasogenic edema in the left temporoparietal lobe with question of underlying mass measuring 3.6 MRI with Five enhancing brain masses, could be CNS lymphoma or even multi focal high-grade glioma or glioblastoma please see full MRI results masses limited to the left hemisphere. - Per Neurosurgery, Given the location and multifocal nature, there  is no role for resection but I did talk with him about possible stereotactic biopsy to gain diagnosis.  We did discuss that the main goal of doing this would be to drop further therapy including chemotherapy and radiation.  He states that his brother would not want to undergo all that treatment at his age and given his other comorbidities and I do think this is reasonable." Continue on Decadron 4 mg IV twice daily for now , may need to transition to po steroid upon d/c (2mg  bid) Neurosurgery's input was appreciated recommended no urgent intervention, after discussion with the family we will hold off on any biopsy Neurochecks Seizure precautions Will consult palliative care, as likely pt will need hospice care.  - Palliative care consulted --patient and patient POA decided for comfort care.  Orders placed in by the palliative care consultant.   2.  Covid 19+: Stable and not needing any supplementary oxygen -No obvious signs of symptoms so far  -Is on Decadron as mentioned above -Covid precaution   Comfort care: Continue comfort care -Comfort care implemented on 10/29/2019  - palliative care is following.  Appreciate palliative care service input and care  - had a discussion with patient and family/POA with the palliative care consultant. Decided for comfort care Case manager social worker and palliative care nurse to work out on discharge plan where comfort care can be implemented Stable  disposition depending on hospice arrangement  DVT prophylaxis: SCD Code Status:  Comfort care Family Communication: none at bedside Disposition Plan: Comfort care    LOS: 8 days     Thornell Mule, MD [facility] Pager [phone]  If 7PM-7AM, please contact night-coverage [email or web address] Password [facility] 12/03/2019, 12:27 PM

## 2019-12-03 NOTE — Plan of Care (Signed)
  Problem: Safety: Goal: Ability to remain free from injury will improve Outcome: Progressing   Problem: Skin Integrity: Goal: Risk for impaired skin integrity will decrease Outcome: Progressing   
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
## 2019-12-04 DIAGNOSIS — G936 Cerebral edema: Principal | ICD-10-CM

## 2019-12-04 NOTE — Progress Notes (Signed)
PROGRESS NOTE    Bryan Buck  [id number] DOB: [birth year] DOA: 11/25/2019 PCP: Kirk Ruths, MD   Brief Narrative:  Bryan Buck a 85 y.o.malewith medical history significant ofcognitive decline, questionable Alzheimer's disease, COPD, diabetes mellitus type 2, pernicious anemia, hyperlipidemia, type 2 diabetes mellitus, pancytopenia in the past, hypertension was brought in by EMS after brother found him not being in his usual cognitive self and had gurgling of speech. CT of the head revealed vasogenic edema in the left temporoparietal lobe with question of underlying mass measuring 3.6.  Neurosurgery was consulted.  Patient also found with COVID-19 positive on admition.  Subjective: Patient was feeling better when seen this morning.  He wants to go home.  Assessment & Plan:   Active Problems:   Type 2 diabetes mellitus with hyperlipidemia (HCC)   Mental status alteration   History of pancytopenia  Altered mental status.  Oriented to self only.  Most likely due to vasogenic edema in left temporoparietal region with multiple brain masses. Neurosurgery was consulted-appreciate their recommendations. Patient is not a candidate for any aggressive measures including chemotherapy or radiation. Family wants to keep him comfortable-waiting for SNF placement with hospice care. -Continue Decadron 4 mg twice daily.  Will need continuation of Decadron 2 mg twice daily p.o. on discharge. -Palliative care is following-appreciate their recommendations.  COVID-19 positive.  Not requiring any supplemental oxygen at this time. -Continue Decadron.   Objective: Vitals:   12/03/19 2108 12/04/19 0453 12/04/19 0824 12/04/19 1619  BP: 137/64 136/64 132/60 129/64  Pulse: (!) 56 (!) 56 (!) 52 (!) 57  Resp:  20 20 19   Temp:  97.7 F (36.5 C) (!) 97.5 F (36.4 C) 98.1 F (36.7 C)  TempSrc:  Oral Oral Oral  SpO2:  97% 98% 99%  Weight:      Height:        Intake/Output  Summary (Last 24 hours) at 12/04/2019 1841 Last data filed at 12/04/2019 1623 Gross per 24 hour  Intake 560 ml  Output 300 ml  Net 260 ml   Filed Weights   11/27/19 0633 11/28/19 0430 11/29/19 0311  Weight: 73.7 kg 77.7 kg 78.1 kg    Examination:  General exam: Appears calm and comfortable  Respiratory system: Clear to auscultation. Respiratory effort normal. Cardiovascular system: S1 & S2 heard, RRR. No JVD, murmurs, rubs, gallops or clicks. No pedal edema. Gastrointestinal system: Abdomen is nondistended, soft and nontender. No organomegaly or masses felt. Normal bowel sounds heard. Central nervous system: Alert and oriented to self only. No focal neurological deficits. Extremities: Symmetric 5 x 5 power. Skin: No rashes, lesions or ulcers Psychiatry: Judgement and insight appear normal. Mood & affect appropriate.    DVT prophylaxis: SCDs Code Status: DNR Family Communication: No family at bedside Disposition Plan: Comfort care-pending bed availability.  Consultants:   Neurosurgery  Palliative  Procedures:  Antimicrobials:   Data Reviewed: I have personally reviewed following labs and imaging studies  CBC: No results for input(s): WBC, NEUTROABS, HGB, HCT, MCV, PLT in the last 168 hours. Basic Metabolic Panel: No results for input(s): NA, K, CL, CO2, GLUCOSE, BUN, CREATININE, CALCIUM, MG, PHOS in the last 168 hours. GFR: Estimated Creatinine Clearance: 53.4 mL/min (by C-G formula based on SCr of 1.15 mg/dL). Liver Function Tests: No results for input(s): AST, ALT, ALKPHOS, BILITOT, PROT, ALBUMIN in the last 168 hours. No results for input(s): LIPASE, AMYLASE in the last 168 hours. No results for input(s): AMMONIA in the last 168 hours. Coagulation  Profile: No results for input(s): INR, PROTIME in the last 168 hours. Cardiac Enzymes: No results for input(s): CKTOTAL, CKMB, CKMBINDEX, TROPONINI in the last 168 hours. BNP (last 3 results) No results for input(s):  PROBNP in the last 8760 hours. HbA1C: No results for input(s): HGBA1C in the last 72 hours. CBG: Recent Labs  Lab 11/28/19 0825 11/28/19 1142 11/28/19 2212 11/30/19 0837 11/30/19 1154  GLUCAP 128* 163* 165* 95 144*   Lipid Profile: No results for input(s): CHOL, HDL, LDLCALC, TRIG, CHOLHDL, LDLDIRECT in the last 72 hours. Thyroid Function Tests: No results for input(s): TSH, T4TOTAL, FREET4, T3FREE, THYROIDAB in the last 72 hours. Anemia Panel: No results for input(s): VITAMINB12, FOLATE, FERRITIN, TIBC, IRON, RETICCTPCT in the last 72 hours. Sepsis Labs: No results for input(s): PROCALCITON, LATICACIDVEN in the last 168 hours.  Recent Results (from the past 240 hour(s))  SARS CORONAVIRUS 2 (TAT 6-24 HRS) Nasopharyngeal Nasopharyngeal Swab     Status: Abnormal   Collection Time: 11/25/19  3:46 PM   Specimen: Nasopharyngeal Swab  Result Value Ref Range Status   SARS Coronavirus 2 POSITIVE (A) NEGATIVE Final    Comment: RESULT CALLED TO, READ BACK BY AND VERIFIED WITH: Juliane Lack, RN 0518 11/26/19 G.[email or web address] (NOTE) SARS-CoV-2 target nucleic acids are DETECTED. The SARS-CoV-2 RNA is generally detectable in upper and lower respiratory specimens during the acute phase of infection. Positive results are indicative of the presence of SARS-CoV-2 RNA. Clinical correlation with patient history and other diagnostic information is  necessary to determine patient infection status. Positive results do not rule out bacterial infection or co-infection with other viruses.  The expected result is Negative. Fact Sheet for Patients: SugarRoll.be Fact Sheet for Healthcare Providers: https://www.woods-mathews.com/ This test is not yet approved or cleared by the [place] FDA and  has been authorized for detection and/or diagnosis of SARS-CoV-2 by FDA under an Emergency Use Authorization (EUA). This EUA will remain  in effect (meaning this test can be  used) for th e duration of the COVID-19 declaration under Section 564(b)(1) of the Act, 21 U.S.C. section 360bbb-3(b)(1), unless the authorization is terminated or revoked sooner. Performed at [facility], [place] [address], [place], [place] [address]      Radiology Studies: No results found.  Scheduled Meds: . dexamethasone  2 mg Oral BID  . sodium chloride flush  3 mL Intravenous Q12H   Continuous Infusions:   LOS: 9 days   Time spent: 40 minutes  Lorella Nimrod, MD [facility] Pager [phone]  If 7PM-7AM, please contact night-coverage [email or web address] Password [facility] 12/04/2019, 6:41 PM   This record has been created using Dragon voice recognition software. Errors have been sought and corrected,but may not always be located. Such creation errors do not reflect on the standard of care.

## 2019-12-04 NOTE — TOC Progression Note (Signed)
Transition of Care [facility]) - Progression Note    Patient Details  Name: Bryan Buck MRN: [id number] Date of Birth: September 06, 1934  Transition of Care [facility]) CM/SW [place], LCSW Phone Number: 12/04/2019, 12:37 PM  Clinical Narrative: No bed offers yet. Left message with [facility] admissions coordinators asking them to review.    Expected Discharge Plan: (Unsure) Barriers to Discharge: Continued Medical Work up  Expected Discharge Plan and Services Expected Discharge Plan: (Unsure)       Living arrangements for the past 2 months: Single Family Home                                       Social Determinants of Health (SDOH) Interventions    Readmission Risk Interventions No flowsheet data found.

## 2019-12-05 NOTE — Progress Notes (Signed)
PROGRESS NOTE    Bryan Buck  [id number] DOB: 1934/05/11 DOA: 11/25/2019 PCP: Bryan Ruths, MD   Brief Narrative:  Bryan Buck a 85 y.o.malewith medical history significant ofcognitive decline, questionable Alzheimer's disease, COPD, diabetes mellitus type 2, pernicious anemia, hyperlipidemia, type 2 diabetes mellitus, pancytopenia in the past, hypertension was brought in by EMS after brother found him not being in his usual cognitive self and had gurgling of speech. CT of the head revealed vasogenic edema in the left temporoparietal lobe with question of underlying mass measuring 3.6.  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  Neurosurgery was consulted-not a good candidate for further intervention and family decided towards comfort measures.  Patient also found with COVID-19 positive on admition.  Subjective: Patient was feeling better when seen this morning.  Denies any pain or shortness of breath.  Assessment & Plan:   Active Problems:   Type 2 diabetes mellitus with hyperlipidemia (HCC)   Mental status alteration   History of pancytopenia   Cerebral edema (HCC)  Altered mental status.  Oriented to self only.  Most likely due to vasogenic edema in left temporoparietal region with multiple brain masses. Neurosurgery was consulted-appreciate their recommendations. Patient is not a candidate for any aggressive measures including chemotherapy or radiation. Family wants to keep him comfortable-waiting for SNF placement with hospice care. -Continue Decadron 4 mg twice daily.  Will need continuation of Decadron 2 mg twice daily p.o. on discharge. -Palliative care is following-appreciate their recommendations.  COVID-19 positive.  Not requiring any supplemental oxygen at this time. -Continue Decadron.   Objective: Vitals:   12/04/19 1619 12/04/19 2111 12/05/19 0345 12/05/19 0801  BP: 129/64 126/68 124/62 136/66  Pulse: (!) 57 (!)  55 (!) 59 (!) 51  Resp: 19 18 20 19   Temp: 98.1 F (36.7 C) 97.9 F (36.6 C) 98.2 F (36.8 C) (!) 97.5 F (36.4 C)  TempSrc: Oral Oral Oral Oral  SpO2: 99% 98% 99% 97%  Weight:      Height:        Intake/Output Summary (Last 24 hours) at 12/05/2019 1439 Last data filed at 12/05/2019 0700 Gross per 24 hour  Intake 0 ml  Output 650 ml  Net -650 ml   Filed Weights   11/27/19 0633 11/28/19 0430 11/29/19 0311  Weight: 73.7 kg 77.7 kg 78.1 kg    Examination:  General exam: Appears calm and comfortable  Respiratory system: Clear to auscultation. Respiratory effort normal. Cardiovascular system: S1 & S2 heard, RRR. No JVD, murmurs, rubs, gallops or clicks. No pedal edema. Gastrointestinal system: Abdomen is nondistended, soft and nontender. No organomegaly or masses felt. Normal bowel sounds heard. Central nervous system: Alert and oriented to self only. No focal neurological deficits. Extremities: Symmetric 5 x 5 power. Skin: No rashes, lesions or ulcers Psychiatry: Judgement and insight appear normal. Mood & affect appropriate.    DVT prophylaxis: SCDs Code Status: DNR Family Communication: No family at bedside Disposition Plan: Comfort care-pending bed availability.  Consultants:   Neurosurgery  Palliative  Procedures:  Antimicrobials:   Data Reviewed: I have personally reviewed following labs and imaging studies  CBC: No results for input(s): WBC, NEUTROABS, HGB, HCT, MCV, PLT in the last 168 hours. Basic Metabolic Panel: No results for input(s): NA, K, CL, CO2, GLUCOSE, BUN, CREATININE, CALCIUM, MG, PHOS in the last 168 hours. GFR: Estimated Creatinine Clearance: 53.4 mL/min (by C-G formula based on SCr of 1.15 mg/dL). Liver Function Tests: No results for input(s): AST, ALT,  ALKPHOS, BILITOT, PROT, ALBUMIN in the last 168 hours. No results for input(s): LIPASE, AMYLASE in the last 168 hours. No results for input(s): AMMONIA in the last 168  hours. Coagulation Profile: No results for input(s): INR, PROTIME in the last 168 hours. Cardiac Enzymes: No results for input(s): CKTOTAL, CKMB, CKMBINDEX, TROPONINI in the last 168 hours. BNP (last 3 results) No results for input(s): PROBNP in the last 8760 hours. HbA1C: No results for input(s): HGBA1C in the last 72 hours. CBG: Recent Labs  Lab 11/28/19 2212 11/30/19 0837 11/30/19 1154  GLUCAP 165* 95 144*   Lipid Profile: No results for input(s): CHOL, HDL, LDLCALC, TRIG, CHOLHDL, LDLDIRECT in the last 72 hours. Thyroid Function Tests: No results for input(s): TSH, T4TOTAL, FREET4, T3FREE, THYROIDAB in the last 72 hours. Anemia Panel: No results for input(s): VITAMINB12, FOLATE, FERRITIN, TIBC, IRON, RETICCTPCT in the last 72 hours. Sepsis Labs: No results for input(s): PROCALCITON, LATICACIDVEN in the last 168 hours.  Recent Results (from the past 240 hour(s))  SARS CORONAVIRUS 2 (TAT 6-24 HRS) Nasopharyngeal Nasopharyngeal Swab     Status: Abnormal   Collection Time: 11/25/19  3:46 PM   Specimen: Nasopharyngeal Swab  Result Value Ref Range Status   SARS Coronavirus 2 POSITIVE (A) NEGATIVE Final    Comment: RESULT CALLED TO, READ BACK BY AND VERIFIED WITH: Bryan Lack, RN 0518 11/26/19 Bryan Buck (NOTE) SARS-CoV-2 target nucleic acids are DETECTED. The SARS-CoV-2 RNA is generally detectable in upper and lower respiratory specimens during the acute phase of infection. Positive results are indicative of the presence of SARS-CoV-2 RNA. Clinical correlation with patient history and other diagnostic information is  necessary to determine patient infection status. Positive results do not rule out bacterial infection or co-infection with other viruses.  The expected result is Negative. Fact Sheet for Patients: SugarRoll.be Fact Sheet for Healthcare Providers: https://www.woods-mathews.com/ This test is not yet approved or cleared by the  [place] FDA and  has been authorized for detection and/or diagnosis of SARS-CoV-2 by FDA under an Emergency Use Authorization (EUA). This EUA will remain  in effect (meaning this test can be used) for th e duration of the COVID-19 declaration under Section 564(b)(1) of the Act, 21 U.S.C. section 360bbb-3(b)(1), unless the authorization is terminated or revoked sooner. Performed at [facility], [place] [address], [place], [place] [address]      Radiology Studies: No results found.  Scheduled Meds: . dexamethasone  2 mg Oral BID  . sodium chloride flush  3 mL Intravenous Q12H   Continuous Infusions:   LOS: 10 days   Time spent: 30 minutes  Lorella Nimrod, MD [facility] Pager [phone]  If 7PM-7AM, please contact night-coverage [email or web address] Password [facility] 12/05/2019, 2:39 PM   This record has been created using Dragon voice recognition software. Errors have been sought and corrected,but may not always be located. Such creation errors do not reflect on the standard of care.

## 2019-12-05 NOTE — Progress Notes (Signed)
Patient is forgetful. Is not aware of his current medical condition and why he was admitted to the hospital. Patient is aware that he needs to stay in the room. Patient ambulates with a steady gait.

## 2019-12-05 NOTE — TOC Progression Note (Addendum)
Transition of Care [facility]) - Progression Note    Patient Details  Name: Bryan Buck MRN: [id number] Date of Birth: [birth year]  Transition [facility] Care [facility]) CM/SW [place], LCSW Phone Number: 12/05/2019, 10:45 AM  Clinical Narrative: [facility] willing to [facility] patient if brother/patient can private pay 30 days up front ($275/day = $8250). Left voicemail for brother. Will see if he got any information from patient's bank.    2:32 pm: Received call back from patient's brother. Made him aware of bed offer from [facility]. He has gone to one of patient's banks and then found out he also goes to another so he is hoping to have all the paperwork regarding patient's financials by tomorrow afternoon and will bring it to the hospital if so. Brother said it appears that patient's mentation has improved from 3-4 days ago.  Expected Discharge Plan: (Unsure) Barriers to Discharge: Continued Medical Work up  Expected Discharge Plan and Services Expected Discharge Plan: (Unsure)       Living arrangements for the past 2 months: Single Family Home                                       Social Determinants of Health (SDOH) Interventions    Readmission Risk Interventions No flowsheet data found.

## 2019-12-06 NOTE — Progress Notes (Signed)
PROGRESS NOTE    Bryan Buck  [id number] DOB: 06-Mar-1934 DOA: 11/25/2019 PCP: Kirk Ruths, MD   Brief Narrative:  [name] a 85 y.o.malewith medical history significant ofcognitive decline, questionable Alzheimer's disease, COPD, diabetes mellitus type 2, pernicious anemia, hyperlipidemia, type 2 diabetes mellitus, pancytopenia in the past, hypertension was brought in by EMS after brother found him not being in his usual cognitive self and had gurgling of speech. CT of the head revealed vasogenic edema in the left temporoparietal lobe with question of underlying mass measuring 3.6.  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  Neurosurgery was consulted-not a good candidate for further intervention and family decided towards comfort measures.  Patient also found with COVID-19 positive on admition.  Subjective: Patient was feeling better when seen this morning.  Denies any pain or shortness of breath.  He wants to go home.  Told patient that his brother is looking for a facility for him.  Assessment & Plan:   Active Problems:   Type 2 diabetes mellitus with hyperlipidemia (HCC)   Mental status alteration   History of pancytopenia   Cerebral edema (HCC)  Altered mental status. Most likely due to vasogenic edema in left temporoparietal region with multiple brain masses. Neurosurgery was consulted-appreciate their recommendations. Patient is not a candidate for any aggressive measures including chemotherapy or radiation. He appears more alert and responsive. Family wants to keep him comfortable-waiting for SNF placement with hospice care. -Continue Decadron 4 mg twice daily.  Will need continuation of Decadron 2 mg twice daily p.o. on discharge. -Palliative care is following-appreciate their recommendations.  COVID-19 positive.  Not requiring any supplemental oxygen at this time. -Continue Decadron.   Objective: Vitals:   12/05/19 0801 12/05/19 1639 12/05/19 2054 12/06/19 0816  BP: 136/66 140/69 (!) 151/68 (!) 148/61  Pulse: (!) 51 60 65 (!) 52  Resp: 19 19  18   Temp: (!) 97.5 F (36.4 C) 98.4 F (36.9 C) 97.7 F (36.5 C) 97.8 F (36.6 C)  TempSrc: Oral Oral Oral Oral  SpO2: 97% 98% 98% 100%  Weight:      Height:        Intake/Output Summary (Last 24 hours) at 12/06/2019 1646 Last data filed at 12/06/2019 0200 Gross per 24 hour  Intake 0 ml  Output 100 ml  Net -100 ml   Filed Weights   11/27/19 0633 11/28/19 0430 11/29/19 0311  Weight: 73.7 kg 77.7 kg 78.1 kg    Examination:  General exam: Appears calm and comfortable  Respiratory system: Clear to auscultation. Respiratory effort normal. Cardiovascular system: S1 & S2 heard, RRR. No JVD, murmurs, rubs, gallops or clicks. No pedal edema. Gastrointestinal system: Abdomen is nondistended, soft and nontender. No organomegaly or masses felt. Normal bowel sounds heard. Central nervous system: Alert and oriented to self only. No focal neurological deficits. Extremities: Symmetric 5 x 5 power. Skin: No rashes, lesions or ulcers Psychiatry: Judgement and insight appear normal. Mood & affect appropriate.    DVT prophylaxis: SCDs Code Status: DNR Family Communication: No family at bedside Disposition Plan: Comfort care-pending bed availability.  Consultants:   Neurosurgery  Palliative  Procedures:  Antimicrobials:   Data Reviewed: I have personally reviewed following labs and imaging studies  CBC: No results for input(s): WBC, NEUTROABS, HGB, HCT, MCV, PLT in the last 168 hours. Basic Metabolic Panel: No results for input(s): NA, K, CL, CO2, GLUCOSE, BUN, CREATININE, CALCIUM, MG, PHOS in the last 168 hours. GFR: Estimated Creatinine Clearance: 53.4 mL/min (  by C-G formula based on SCr of 1.15 mg/dL). Liver Function Tests: No results for input(s): AST, ALT, ALKPHOS, BILITOT, PROT, ALBUMIN in the last 168 hours. No results for  input(s): LIPASE, AMYLASE in the last 168 hours. No results for input(s): AMMONIA in the last 168 hours. Coagulation Profile: No results for input(s): INR, PROTIME in the last 168 hours. Cardiac Enzymes: No results for input(s): CKTOTAL, CKMB, CKMBINDEX, TROPONINI in the last 168 hours. BNP (last 3 results) No results for input(s): PROBNP in the last 8760 hours. HbA1C: No results for input(s): HGBA1C in the last 72 hours. CBG: Recent Labs  Lab 11/30/19 0837 11/30/19 1154  GLUCAP 95 144*   Lipid Profile: No results for input(s): CHOL, HDL, LDLCALC, TRIG, CHOLHDL, LDLDIRECT in the last 72 hours. Thyroid Function Tests: No results for input(s): TSH, T4TOTAL, FREET4, T3FREE, THYROIDAB in the last 72 hours. Anemia Panel: No results for input(s): VITAMINB12, FOLATE, FERRITIN, TIBC, IRON, RETICCTPCT in the last 72 hours. Sepsis Labs: No results for input(s): PROCALCITON, LATICACIDVEN in the last 168 hours.  No results found for this or any previous visit (from the past 240 hour(s)).   Radiology Studies: No results found.  Scheduled Meds: . dexamethasone  2 mg Oral BID  . sodium chloride flush  3 mL Intravenous Q12H   Continuous Infusions:   LOS: 11 days   Time spent: 30 minutes  Lorella Nimrod, MD [facility] Pager [phone]  If 7PM-7AM, please contact night-coverage [email or web address] Password [facility] 12/06/2019, 4:46 PM   This record has been created using Dragon voice recognition software. Errors have been sought and corrected,but may not always be located. Such creation errors do not reflect on the standard of care.

## 2019-12-06 NOTE — TOC Progression Note (Addendum)
Transition of Care [facility]) - Progression Note    Patient Details  Name: Treveion Recio MRN: [id number] Date of Birth: 1934-12-01  Transition [facility]) CM/SW [place], LCSW Phone Number: 12/06/2019, 11:55 AM  Clinical Narrative: Patient's brother has spoken to both of his banks and they are putting him on both checking accounts. This takes 24 hours to do. Brother will also receive new checks connected to these accounts. [employer] is going to fax CSW some information and brother will bring the rest to the hospital today. Will sent to [facility] once obtained.    3:29 pm: Brother dropped off bank information. CSW coworker faxed to [facility].  3:58 pm: Patient only has about enough to pay for two months at [facility] and they do not have any LTC beds. They are going to see [name] him because they are also taking COVID patients and have LTC beds available.  Expected Discharge Plan: (Unsure) Barriers to Discharge: Continued Medical Work up  Expected Discharge Plan and Services Expected Discharge Plan: (Unsure)       Living arrangements for the past 2 months: Single Family Home                                       Social Determinants of Health (SDOH) Interventions    Readmission Risk Interventions No flowsheet data found.

## 2019-12-07 NOTE — Plan of Care (Signed)
  Problem: Education: Goal: Knowledge of General Education information will improve Description: Including pain rating scale, medication(s)/side effects and non-pharmacologic comfort measures Outcome: Progressing   Problem: Health Behavior/Discharge Planning: Goal: Ability to manage health-related needs will improve Outcome: Progressing   Problem: Clinical Measurements: Goal: Ability to maintain clinical measurements within normal limits will improve Outcome: Progressing Goal: Will remain free from infection Outcome: Progressing Goal: Diagnostic test results will improve Outcome: Progressing Goal: Respiratory complications will improve Outcome: Progressing Goal: Cardiovascular complication will be avoided Outcome: Progressing   Problem: Activity: Goal: Risk for activity intolerance will decrease Outcome: Progressing   Problem: Nutrition: Goal: Adequate nutrition will be maintained Outcome: Progressing   Problem: Coping: Goal: Level of anxiety will decrease Outcome: Progressing   Problem: Elimination: Goal: Will not experience complications related to bowel motility Outcome: Progressing Goal: Will not experience complications related to urinary retention Outcome: Progressing   Problem: Pain Managment: Goal: General experience of comfort will improve Outcome: Progressing   Problem: Safety: Goal: Ability to remain free from injury will improve Outcome: Progressing   Problem: Skin Integrity: Goal: Risk for impaired skin integrity will decrease Outcome: Progressing   Problem: Education: Goal: Knowledge of the prescribed therapeutic regimen will improve Outcome: Progressing   Problem: Coping: Goal: Ability to identify and develop effective coping behavior will improve Outcome: Progressing   Problem: Clinical Measurements: Goal: Quality of life will improve Outcome: Progressing   Problem: Respiratory: Goal: Verbalizations of increased ease of respirations will  increase Outcome: Progressing   Problem: Role Relationship: Goal: Family's ability to cope with current situation will improve Outcome: Progressing Goal: Ability to verbalize concerns, feelings, and thoughts to partner or family member will improve Outcome: Progressing   Problem: Pain Management: Goal: Satisfaction with pain management regimen will improve Outcome: Progressing   Problem: Education: Goal: Knowledge of risk factors and measures for prevention of condition will improve Outcome: Progressing   Problem: Coping: Goal: Psychosocial and spiritual needs will be supported Outcome: Progressing   Problem: Respiratory: Goal: Will maintain a patent airway Outcome: Progressing Goal: Complications related to the disease process, condition or treatment will be avoided or minimized Outcome: Progressing

## 2019-12-07 NOTE — Progress Notes (Signed)
PROGRESS NOTE    Bryan Buck  [id number] DOB: [birth year] DOA: 11/25/2019 PCP: Kirk Ruths, MD   Brief Narrative:  Bryan Buck a 85 y.o.malewith medical history significant ofcognitive decline, questionable Alzheimer's disease, COPD, diabetes mellitus type 2, pernicious anemia, hyperlipidemia, type 2 diabetes mellitus, pancytopenia in the past, hypertension was brought in by EMS after brother found him not being in his usual cognitive self and had gurgling of speech. CT of the head revealed vasogenic edema in the left temporoparietal lobe with question of underlying mass measuring 3.6.  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  Neurosurgery was consulted-not a good candidate for further intervention and family decided towards comfort measures.  Patient also found with COVID-19 positive on admition.  Subjective: Patient was feeling better when seen this morning.  Denies any pain or shortness of breath.  He wants to go home, stating he had a big house and everything can be set there.  He was able to answer appropriately today.  Talked with his brother on phone, who told me that patient cannot take care of himself by any means and his house is erratic.  According to brother he was a very well organized and clean person but for some time he stopped taking care of himself or his place.  He will be better off at a skilled nursing facility.  He is trying his best to find him a good place.  He himself is older than him and cannot take care of him.  Assessment & Plan:   Active Problems:   Type 2 diabetes mellitus with hyperlipidemia (HCC)   Mental status alteration   History of pancytopenia   Cerebral edema (HCC)  Altered mental status.  Resolved. Most likely due to vasogenic edema in left temporoparietal region with multiple brain masses. Neurosurgery was consulted-appreciate their recommendations. Patient is not a candidate for any aggressive  measures including chemotherapy or radiation. He appears more alert and responsive. Family wants to keep him comfortable-waiting for SNF placement with hospice care. -Continue Decadron 4 mg twice daily.  Will need continuation of Decadron 2 mg twice daily p.o. on discharge. -Palliative care is following-appreciate their recommendations.  -Brother is looking for a better place where SNF is available with LPF as patient cannot take care of himself and has no other family member available.  COVID-19 positive.  Not requiring any supplemental oxygen at this time. -Continue Decadron.  Objective: Vitals:   12/05/19 2054 12/06/19 0816 12/06/19 2014 12/07/19 1011  BP: (!) 151/68 (!) 148/61 134/65 133/61  Pulse: 65 (!) 52 66 63  Resp:  18 20 19   Temp: 97.7 F (36.5 C) 97.8 F (36.6 C) 98.4 F (36.9 C) 98.2 F (36.8 C)  TempSrc: Oral Oral Oral   SpO2: 98% 100% 97% 99%  Weight:      Height:        Intake/Output Summary (Last 24 hours) at 12/07/2019 1159 Last data filed at 12/06/2019 2130 Gross per 24 hour  Intake --  Output 600 ml  Net -600 ml   Filed Weights   11/27/19 0633 11/28/19 0430 11/29/19 0311  Weight: 73.7 kg 77.7 kg 78.1 kg    Examination:  General exam: Appears calm and comfortable  Respiratory system: Clear to auscultation. Respiratory effort normal. Cardiovascular system: S1 & S2 heard, RRR. No JVD, murmurs, rubs, gallops or clicks. No pedal edema. Gastrointestinal system: Abdomen is nondistended, soft and nontender. No organomegaly or masses felt. Normal bowel sounds heard. Central nervous system: Alert  and oriented to self only. No focal neurological deficits. Extremities: Symmetric 5 x 5 power. Skin: No rashes, lesions or ulcers Psychiatry: Judgement and insight appear normal. Mood & affect appropriate.    DVT prophylaxis: SCDs Code Status: DNR Family Communication: No family at bedside.  Updated brother on phone. Disposition Plan: Comfort care-pending bed  availability.  Consultants:   Neurosurgery  Palliative  Procedures:  Antimicrobials:   Data Reviewed: I have personally reviewed following labs and imaging studies  CBC: No results for input(s): WBC, NEUTROABS, HGB, HCT, MCV, PLT in the last 168 hours. Basic Metabolic Panel: No results for input(s): NA, K, CL, CO2, GLUCOSE, BUN, CREATININE, CALCIUM, MG, PHOS in the last 168 hours. GFR: Estimated Creatinine Clearance: 53.4 mL/min (by C-G formula based on SCr of 1.15 mg/dL). Liver Function Tests: No results for input(s): AST, ALT, ALKPHOS, BILITOT, PROT, ALBUMIN in the last 168 hours. No results for input(s): LIPASE, AMYLASE in the last 168 hours. No results for input(s): AMMONIA in the last 168 hours. Coagulation Profile: No results for input(s): INR, PROTIME in the last 168 hours. Cardiac Enzymes: No results for input(s): CKTOTAL, CKMB, CKMBINDEX, TROPONINI in the last 168 hours. BNP (last 3 results) No results for input(s): PROBNP in the last 8760 hours. HbA1C: No results for input(s): HGBA1C in the last 72 hours. CBG: No results for input(s): GLUCAP in the last 168 hours. Lipid Profile: No results for input(s): CHOL, HDL, LDLCALC, TRIG, CHOLHDL, LDLDIRECT in the last 72 hours. Thyroid Function Tests: No results for input(s): TSH, T4TOTAL, FREET4, T3FREE, THYROIDAB in the last 72 hours. Anemia Panel: No results for input(s): VITAMINB12, FOLATE, FERRITIN, TIBC, IRON, RETICCTPCT in the last 72 hours. Sepsis Labs: No results for input(s): PROCALCITON, LATICACIDVEN in the last 168 hours.  No results found for this or any previous visit (from the past 240 hour(s)).   Radiology Studies: No results found.  Scheduled Meds: . dexamethasone  2 mg Oral BID  . sodium chloride flush  3 mL Intravenous Q12H   Continuous Infusions:   LOS: 12 days   Time spent: 30 minutes  Lorella Nimrod, MD [facility] Pager [phone]  If 7PM-7AM, please contact  night-coverage [email or web address] Password [facility] 12/07/2019, 11:59 AM   This record has been created using Systems analyst. Errors have been sought and corrected,but may not always be located. Such creation errors do not reflect on the standard of care.

## 2019-12-08 NOTE — Progress Notes (Signed)
PROGRESS NOTE    Bryan Buck  [id number] DOB: 06/06/1934 DOA: 11/25/2019 PCP: Kirk Ruths, MD   Brief Narrative:  Bryan Buck a 85 y.o.malewith medical history significant ofcognitive decline, questionable Alzheimer's disease, COPD, diabetes mellitus type 2, pernicious anemia, hyperlipidemia, type 2 diabetes mellitus, pancytopenia in the past, hypertension was brought in by EMS after brother found him not being in his usual cognitive self and had gurgling of speech. CT of the head revealed vasogenic edema in the left temporoparietal lobe with question of underlying mass measuring 3.6.  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  Neurosurgery was consulted-not a good candidate for further intervention and family decided towards comfort measures.  Patient also found with COVID-19 positive on admition.  Subjective: Patient was irritated that why we are not letting him go home.  Try explaining that his brother is try to look for a place.  Stating that he does not understand why he has to go to a facility when he has home and he is able to walk.  Assessment & Plan:   Active Problems:   Type 2 diabetes mellitus with hyperlipidemia (HCC)   Mental status alteration   History of pancytopenia   Cerebral edema (HCC)  Altered mental status.  Resolved. Most likely due to vasogenic edema in left temporoparietal region with multiple brain masses. Neurosurgery was consulted-appreciate their recommendations. Patient is not a candidate for any aggressive measures including chemotherapy or radiation. He appears more alert and responsive. Family wants to keep him comfortable-waiting for SNF placement with hospice care. -Continue Decadron 4 mg twice daily.  Will need continuation of Decadron 2 mg twice daily p.o. on discharge. -Palliative care is following-appreciate their recommendations.  -Brother is looking for a better place where SNF is available  with LPF as patient cannot take care of himself and has no other family member available.  COVID-19 positive.  Not requiring any supplemental oxygen at this time. -Continue Decadron.  Objective: Vitals:   12/06/19 2014 12/07/19 1011 12/07/19 2022 12/08/19 0835  BP: 134/65 133/61 135/70 (!) 144/61  Pulse: 66 63 65 (!) 50  Resp: 20 19 18 18   Temp: 98.4 F (36.9 C) 98.2 F (36.8 C) 98.3 F (36.8 C) 98.3 F (36.8 C)  TempSrc: Oral  Oral   SpO2: 97% 99% 99% 100%  Weight:      Height:       No intake or output data in the 24 hours ending 12/08/19 1414 Filed Weights   11/27/19 0633 11/28/19 0430 11/29/19 0311  Weight: 73.7 kg 77.7 kg 78.1 kg    Examination:  General exam: Appears annoyed but comfortable  Respiratory system: Clear to auscultation. Respiratory effort normal. Cardiovascular system: S1 & S2 heard, RRR. No JVD, murmurs, rubs, gallops or clicks. No pedal edema. Gastrointestinal system: Abdomen is nondistended, soft and nontender. No organomegaly or masses felt. Normal bowel sounds heard. Central nervous system: Alert and oriented to self only. No focal neurological deficits. Extremities: Symmetric 5 x 5 power. Skin: No rashes, lesions or ulcers Psychiatry: Appears little agitated.   DVT prophylaxis: SCDs Code Status: DNR Family Communication: No family at bedside.  Updated brother on phone. Disposition Plan: Comfort care-pending bed availability.  Consultants:   Neurosurgery  Palliative  Procedures:  Antimicrobials:   Data Reviewed: I have personally reviewed following labs and imaging studies  CBC: No results for input(s): WBC, NEUTROABS, HGB, HCT, MCV, PLT in the last 168 hours. Basic Metabolic Panel: No results for input(s): NA, K,  CL, CO2, GLUCOSE, BUN, CREATININE, CALCIUM, MG, PHOS in the last 168 hours. GFR: Estimated Creatinine Clearance: 53.4 mL/min (by C-G formula based on SCr of 1.15 mg/dL). Liver Function Tests: No results for input(s):  AST, ALT, ALKPHOS, BILITOT, PROT, ALBUMIN in the last 168 hours. No results for input(s): LIPASE, AMYLASE in the last 168 hours. No results for input(s): AMMONIA in the last 168 hours. Coagulation Profile: No results for input(s): INR, PROTIME in the last 168 hours. Cardiac Enzymes: No results for input(s): CKTOTAL, CKMB, CKMBINDEX, TROPONINI in the last 168 hours. BNP (last 3 results) No results for input(s): PROBNP in the last 8760 hours. HbA1C: No results for input(s): HGBA1C in the last 72 hours. CBG: No results for input(s): GLUCAP in the last 168 hours. Lipid Profile: No results for input(s): CHOL, HDL, LDLCALC, TRIG, CHOLHDL, LDLDIRECT in the last 72 hours. Thyroid Function Tests: No results for input(s): TSH, T4TOTAL, FREET4, T3FREE, THYROIDAB in the last 72 hours. Anemia Panel: No results for input(s): VITAMINB12, FOLATE, FERRITIN, TIBC, IRON, RETICCTPCT in the last 72 hours. Sepsis Labs: No results for input(s): PROCALCITON, LATICACIDVEN in the last 168 hours.  No results found for this or any previous visit (from the past 240 hour(s)).   Radiology Studies: No results found.  Scheduled Meds: . dexamethasone  2 mg Oral BID  . sodium chloride flush  3 mL Intravenous Q12H   Continuous Infusions:   LOS: 13 days   Time spent: 30 minutes  Lorella Nimrod, MD [facility] Pager [phone]  If 7PM-7AM, please contact night-coverage [email or web address] Password [facility] 12/08/2019, 2:14 PM   This record has been created using Dragon voice recognition software. Errors have been sought and corrected,but may not always be located. Such creation errors do not reflect on the standard of care.

## 2019-12-08 NOTE — TOC Progression Note (Addendum)
Transition of Care [facility]) - Progression Note    Patient Details  Name: Harland Decrane MRN: [id number] Date of Birth: [birth year]  Transition of [facility]) CM/SW Contact  [address] [place], [place] Phone Number: 12/08/2019, 3:33 PM  Clinical Narrative:    [facility] call to [facility] to follow up on possible bed offer. There was no one available to discuss admission. It was recommended that this CSW call back tomorrow regarding possible bed offer.   Elliot Gurney, [place] Clinical Social Worker  [phone]     Expected Discharge Plan: (Unsure) Barriers to Discharge: Continued Medical Work up  Expected Discharge Plan and Services Expected Discharge Plan: (Unsure) In-house Referral: Clinical Social Work   Post Acute Care Choice: (error) Living arrangements for the past 2 months: Single Family Home                           HH Arranged: PT, OT HH Agency: [place] Date Community [facility] Agency Contacted: 12/08/19 Time [place]: 1217 Representative spoke with at [place]: [place]  and [place] Determinants of Health ([place]) Interventions    Readmission Risk Interventions No flowsheet data found.

## 2019-12-08 NOTE — Plan of Care (Signed)
Problem: Education: Goal: Knowledge of General Education information will improve Description: Including pain rating scale, medication(s)/side effects and non-pharmacologic comfort measures Outcome: Progressing   Problem: Health Behavior/Discharge Planning: Goal: Ability to manage health-related needs will improve Outcome: Progressing   Problem: Clinical Measurements: Goal: Ability to maintain clinical measurements within normal limits will improve Outcome: Progressing Goal: Will remain free from infection Outcome: Progressing Goal: Diagnostic test results will improve Outcome: Progressing Goal: Respiratory complications will improve Outcome: Progressing Goal: Cardiovascular complication will be avoided Outcome: Progressing   Problem: Activity: Goal: Risk for activity intolerance will decrease Outcome: Progressing   Problem: Nutrition: Goal: Adequate nutrition will be maintained Outcome: Progressing   Problem: Coping: Goal: Level of anxiety will decrease Outcome: Progressing   Problem: Elimination: Goal: Will not experience complications related to bowel motility Outcome: Progressing Goal: Will not experience complications related to urinary retention Outcome: Progressing   Problem: Pain Managment: Goal: General experience of comfort will improve Outcome: Progressing   Problem: Safety: Goal: Ability to remain free from injury will improve Outcome: Progressing   Problem: Skin Integrity: Goal: Risk for impaired skin integrity will decrease Outcome: Progressing   Problem: Education: Goal: Knowledge of the prescribed therapeutic regimen will improve Outcome: Progressing   Problem: Coping: Goal: Ability to identify and develop effective coping behavior will improve Outcome: Progressing   Problem: Clinical Measurements: Goal: Quality of life will improve Outcome: Progressing   Problem: Respiratory: Goal: Verbalizations of increased ease of respirations will  increase Outcome: Progressing   Problem: Role Relationship: Goal: Family's ability to cope with current situation will improve Outcome: Progressing Goal: Ability to verbalize concerns, feelings, and thoughts to partner or family member will improve Outcome: Progressing   Problem: Pain Management: Goal: Satisfaction with pain management regimen will improve Outcome: Progressing   Problem: Education: Goal: Knowledge of risk factors and measures for prevention of condition will improve Outcome: Progressing   Problem: Coping: Goal: Psychosocial and spiritual needs will be supported Outcome: Progressing   Problem: Respiratory: Goal: Will maintain a patent airway Outcome: Progressing Goal: Complications related to the disease process, condition or treatment will be avoided or minimized Outcome: Progressing   Problem: Education: Goal: Knowledge of General Education information will improve Description: Including pain rating scale, medication(s)/side effects and non-pharmacologic comfort measures Outcome: Progressing   Problem: Health Behavior/Discharge Planning: Goal: Ability to manage health-related needs will improve Outcome: Progressing   Problem: Clinical Measurements: Goal: Ability to maintain clinical measurements within normal limits will improve Outcome: Progressing Goal: Will remain free from infection Outcome: Progressing Goal: Diagnostic test results will improve Outcome: Progressing Goal: Respiratory complications will improve Outcome: Progressing Goal: Cardiovascular complication will be avoided Outcome: Progressing   Problem: Activity: Goal: Risk for activity intolerance will decrease Outcome: Progressing   Problem: Nutrition: Goal: Adequate nutrition will be maintained Outcome: Progressing   Problem: Coping: Goal: Level of anxiety will decrease Outcome: Progressing   Problem: Elimination: Goal: Will not experience complications related to bowel  motility Outcome: Progressing Goal: Will not experience complications related to urinary retention Outcome: Progressing   Problem: Pain Managment: Goal: General experience of comfort will improve Outcome: Progressing   Problem: Safety: Goal: Ability to remain free from injury will improve Outcome: Progressing   Problem: Skin Integrity: Goal: Risk for impaired skin integrity will decrease Outcome: Progressing   Problem: Education: Goal: Knowledge of the prescribed therapeutic regimen will improve Outcome: Progressing   Problem: Coping: Goal: Ability to identify and develop effective coping behavior will improve Outcome: Progressing   Problem:  Clinical Measurements: Goal: Quality of life will improve Outcome: Progressing   Problem: Respiratory: Goal: Verbalizations of increased ease of respirations will increase Outcome: Progressing   Problem: Role Relationship: Goal: Family's ability to cope with current situation will improve Outcome: Progressing Goal: Ability to verbalize concerns, feelings, and thoughts to partner or family member will improve Outcome: Progressing   Problem: Pain Management: Goal: Satisfaction with pain management regimen will improve Outcome: Progressing   Problem: Education: Goal: Knowledge of risk factors and measures for prevention of condition will improve Outcome: Progressing   Problem: Coping: Goal: Psychosocial and spiritual needs will be supported Outcome: Progressing   Problem: Respiratory: Goal: Will maintain a patent airway Outcome: Progressing Goal: Complications related to the disease process, condition or treatment will be avoided or minimized Outcome: Progressing

## 2019-12-09 NOTE — TOC Progression Note (Addendum)
Transition of Care [facility]) - Progression Note    Patient Details  Name: Bryan Buck MRN: [id number] Date of Birth: [birth year]  Transition of Care [facility]) CM/SW [place], LCSW Phone Number: 12/09/2019, 8:56 AM  Clinical Narrative: Left message for [employer] admissions coordinator to see if she reviewed the information [place] sent to her on Friday.    2:08 pm: [facility] has not determined if they can accept him or not.  Expected Discharge Plan: (Unsure) Barriers to Discharge: Continued Medical Work up  Expected Discharge Plan and Services Expected Discharge Plan: (Unsure) In-house Referral: Clinical Social Work   Post Acute Care Choice: (error) Living arrangements for the past 2 months: [place]: PT, OT HH Agency: [place] Date [place]: 12/08/19 Time [place]: 1217 Representative spoke with at [place]: [place]  and [place] Determinants of Health ([place]) Interventions    Readmission Risk Interventions No flowsheet data found.
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
## 2019-12-09 NOTE — Progress Notes (Signed)
PROGRESS NOTE    Bryan Buck  [id number] DOB: 11-16-1934 DOA: 11/25/2019 PCP: Kirk Ruths, MD   Brief Narrative:  Bryan Buck a 85 y.o.malewith medical history significant ofcognitive decline, questionable Alzheimer's disease, COPD, diabetes mellitus type 2, pernicious anemia, hyperlipidemia, type 2 diabetes mellitus, pancytopenia in the past, hypertension was brought in by EMS after brother found him not being in his usual cognitive self and had gurgling of speech. CT of the head revealed vasogenic edema in the left temporoparietal lobe with question of underlying mass measuring 3.6.  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  Neurosurgery was consulted-not a good candidate for further intervention and family decided towards comfort measures.  Patient also found with COVID-19 positive on admition.  Subjective: Patient was tired when seen this morning, stating that he is sleepy and would like to get some sleep.  No other complaints.  Assessment & Plan:   Active Problems:   Type 2 diabetes mellitus with hyperlipidemia (HCC)   Mental status alteration   History of pancytopenia   Cerebral edema (HCC)  Altered mental status.  Resolved. Most likely due to vasogenic edema in left temporoparietal region with multiple brain masses. Neurosurgery was consulted-appreciate their recommendations. Patient is not a candidate for any aggressive measures including chemotherapy or radiation. He appears more alert and responsive. Family wants to keep him comfortable-waiting for SNF placement with hospice care. -Continue Decadron 4 mg twice daily.  Will need continuation of Decadron 2 mg twice daily p.o. on discharge. -Palliative care is following-appreciate their recommendations.  -Brother is looking for a better place where SNF is available with LPF as patient cannot take care of himself and has no other family member available.  COVID-19 positive.   Not requiring any supplemental oxygen at this time. -Continue Decadron.  Objective: Vitals:   12/07/19 2022 12/08/19 0835 12/08/19 2018 12/09/19 0845  BP: 135/70 (!) 144/61 (!) 141/62 (!) 141/67  Pulse: 65 (!) 50 69 (!) 49  Resp: 18 18  18   Temp: 98.3 F (36.8 C) 98.3 F (36.8 C) 97.7 F (36.5 C) 98.2 F (36.8 C)  TempSrc: Oral  Oral   SpO2: 99% 100% 96% 99%  Weight:      Height:        Intake/Output Summary (Last 24 hours) at 12/09/2019 1557 Last data filed at 12/09/2019 0930 Gross per 24 hour  Intake --  Output 1 ml  Net -1 ml   Filed Weights   11/27/19 [id number] 11/28/19 0430 11/29/19 0311  Weight: 73.7 kg 77.7 kg 78.1 kg    Examination:  General exam: Chronically ill-appearing elderly man, in no acute distress. Respiratory system: Clear to auscultation. Respiratory effort normal. Cardiovascular system: S1 & S2 heard, RRR. No JVD, murmurs, rubs, gallops or clicks. No pedal edema. Gastrointestinal system: Abdomen is nondistended, soft and nontender. No organomegaly or masses felt. Normal bowel sounds heard. Central nervous system: Alert and oriented to self only. No focal neurological deficits. Extremities: Symmetric 5 x 5 power. Skin: No rashes, lesions or ulcers Psychiatry: Appears little agitated.   DVT prophylaxis: SCDs Code Status: DNR Family Communication: No family at bedside.  Updated brother on phone. Disposition Plan: Comfort care-pending bed availability.  Consultants:   Neurosurgery  Palliative  Procedures:  Antimicrobials:   Data Reviewed: I have personally reviewed following labs and imaging studies  CBC: No results for input(s): WBC, NEUTROABS, HGB, HCT, MCV, PLT in the last 168 hours. Basic Metabolic Panel: No results for input(s): NA, K, CL,  CO2, GLUCOSE, BUN, CREATININE, CALCIUM, MG, PHOS in the last 168 hours. GFR: Estimated Creatinine Clearance: 53.4 mL/min (by C-G formula based on SCr of 1.15 mg/dL). Liver Function Tests: No  results for input(s): AST, ALT, ALKPHOS, BILITOT, PROT, ALBUMIN in the last 168 hours. No results for input(s): LIPASE, AMYLASE in the last 168 hours. No results for input(s): AMMONIA in the last 168 hours. Coagulation Profile: No results for input(s): INR, PROTIME in the last 168 hours. Cardiac Enzymes: No results for input(s): CKTOTAL, CKMB, CKMBINDEX, TROPONINI in the last 168 hours. BNP (last 3 results) No results for input(s): PROBNP in the last 8760 hours. HbA1C: No results for input(s): HGBA1C in the last 72 hours. CBG: No results for input(s): GLUCAP in the last 168 hours. Lipid Profile: No results for input(s): CHOL, HDL, LDLCALC, TRIG, CHOLHDL, LDLDIRECT in the last 72 hours. Thyroid Function Tests: No results for input(s): TSH, T4TOTAL, FREET4, T3FREE, THYROIDAB in the last 72 hours. Anemia Panel: No results for input(s): VITAMINB12, FOLATE, FERRITIN, TIBC, IRON, RETICCTPCT in the last 72 hours. Sepsis Labs: No results for input(s): PROCALCITON, LATICACIDVEN in the last 168 hours.  No results found for this or any previous visit (from the past 240 hour(s)).   Radiology Studies: No results found.  Scheduled Meds: . dexamethasone  2 mg Oral BID  . sodium chloride flush  3 mL Intravenous Q12H   Continuous Infusions:   LOS: 14 days   Time spent: 30 minutes  Lorella Nimrod, MD [facility] Pager [phone]  If 7PM-7AM, please contact night-coverage [email or web address] Password [facility] 12/09/2019, 3:57 PM   This record has been created using Systems analyst. Errors have been sought and corrected,but may not always be located. Such creation errors do not reflect on the standard of care.

## 2019-12-10 NOTE — TOC Progression Note (Addendum)
Transition of Care [facility]) - Progression Note    Patient Details  Name: Bryan Buck MRN: [id number] Date of Birth: Apr 27, 1934  Transition of Care [facility]) CM/SW [place], LCSW Phone Number: 12/10/2019, 9:58 AM  Clinical Narrative: Bryan Buck admissions coordinator will review case with administrator today and should have an answer this afternoon.    3:33 pm: CMA spoke to patient's brother who now wants patient placed closer to [facility] so that he is closer to him. She explained to him that we were working on [employer] CSW left messages at [facility] [facility] in [place] and [place] in [place].  3:52 pm: Received voicemail from [employer]. They would like to review the referral. CSW coworker will fax clinicals.  Expected Discharge Plan: (Unsure) Barriers to Discharge: Continued Medical Work up  Expected Discharge Plan and Services Expected Discharge Plan: (Unsure) In-house Referral: Clinical Social Work   Post Acute Care Choice: (error) Living arrangements for the past 2 months: [place]: PT, OT HH Agency: [place] Date [place]: 12/08/19 Time [place]: 1217 Representative spoke with at [place]: [place]  and [place] Determinants of Health ([place]) Interventions    Readmission Risk Interventions No flowsheet data found.

## 2019-12-10 NOTE — Plan of Care (Signed)
  Problem: Activity: Goal: Risk for activity intolerance will decrease Outcome: Progressing   Problem: Pain Managment: Goal: General experience of comfort will improve Outcome: Progressing   Problem: Respiratory: Goal: Verbalizations of increased ease of respirations will increase Outcome: Progressing   Problem: Respiratory: Goal: Will maintain a patent airway Outcome: Progressing Goal: Complications related to the disease process, condition or treatment will be avoided or minimized Outcome: Progressing

## 2019-12-10 NOTE — Plan of Care (Signed)
  Problem: Clinical Measurements: Goal: Ability to maintain clinical measurements within normal limits will improve Outcome: Progressing Goal: Will remain free from infection Outcome: Progressing   Problem: Activity: Goal: Risk for activity intolerance will decrease Outcome: Progressing   Problem: Respiratory: Goal: Verbalizations of increased ease of respirations will increase Outcome: Progressing   Problem: Respiratory: Goal: Will maintain a patent airway Outcome: Progressing Goal: Complications related to the disease process, condition or treatment will be avoided or minimized Outcome: Progressing

## 2019-12-10 NOTE — Care Management Important Message (Signed)
Important Message  Patient Details  Name: Bryan Buck MRN: [id number] Date of Birth: Sep 15, 1934   Medicare Important Message Given:  Yes  Reviewed verbally with brother, Statler Broas, via phone [phone].  Confirmed he received the copy of the Medicare IM mailed to him last week.     Dannette Barbara 12/10/2019, 3:20 PM

## 2019-12-10 NOTE — Progress Notes (Signed)
PROGRESS NOTE    Bryan Buck  [id number] DOB: 1934-06-22 DOA: 11/25/2019 PCP: Kirk Ruths, MD   Brief Narrative:  Bryan Buck a 85 y.o.malewith medical history significant ofcognitive decline, questionable Alzheimer's disease, COPD, diabetes mellitus type 2, pernicious anemia, hyperlipidemia, type 2 diabetes mellitus, pancytopenia in the past, hypertension was brought in by EMS after brother found him not being in his usual cognitive self and had gurgling of speech. CT of the head revealed vasogenic edema in the left temporoparietal lobe with question of underlying mass measuring 3.6.  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  Neurosurgery was consulted-not a good candidate for further intervention and family decided towards comfort measures.  Patient also found with COVID-19 positive on admission. Never required any oxygen.  Subjective: Patient was feeling better when seen this morning.  No complaints.  He wants to go home.  Assessment & Plan:   Active Problems:   Type 2 diabetes mellitus with hyperlipidemia (HCC)   Mental status alteration   History of pancytopenia   Cerebral edema (HCC)  Altered mental status.  Resolved. Most likely due to vasogenic edema in left temporoparietal region with multiple brain masses. Neurosurgery was consulted-appreciate their recommendations. Patient is not a candidate for any aggressive measures including chemotherapy or radiation. He appears more alert and responsive. Family wants to keep him comfortable-waiting for SNF placement with hospice care. -Continue Decadron 4 mg twice daily.  Will need continuation of Decadron 2 mg twice daily p.o. on discharge. -Palliative care is following-appreciate their recommendations.  -Brother is looking for a better place where SNF is available with ALF as patient cannot take care of himself and has no other family member available.  COVID-19 positive.  Not  requiring any supplemental oxygen at this time. -Continue Decadron.  Objective: Vitals:   12/08/19 2018 12/09/19 0845 12/10/19 0410 12/10/19 0921  BP: (!) 141/62 (!) 141/67 129/60 (!) 119/59  Pulse: 69 (!) 49 (!) 51 73  Resp:  18 20 18   Temp: 97.7 F (36.5 C) 98.2 F (36.8 C) 97.6 F (36.4 C) 97.9 F (36.6 C)  TempSrc: Oral  Oral Axillary  SpO2: 96% 99% 96% 100%  Weight:   75 kg   Height:       No intake or output data in the 24 hours ending 12/10/19 1537 Filed Weights   11/28/19 0430 11/29/19 0311 12/10/19 0410  Weight: 77.7 kg 78.1 kg 75 kg    Examination:  General exam: Chronically ill-appearing elderly man, in no acute distress. Respiratory system: Clear to auscultation. Respiratory effort normal. Cardiovascular system: S1 & S2 heard, RRR. No JVD, murmurs, rubs, gallops or clicks. No pedal edema. Gastrointestinal system: Abdomen is nondistended, soft and nontender. No organomegaly or masses felt. Normal bowel sounds heard. Central nervous system: Alert and oriented to self only. No focal neurological deficits. Extremities: Symmetric 5 x 5 power. Skin: No rashes, lesions or ulcers Psychiatry: Appears little agitated.   DVT prophylaxis: SCDs Code Status: DNR Family Communication: No family at bedside.  Updated brother on phone. Disposition Plan: Comfort care-pending bed availability.  Consultants:   Neurosurgery  Palliative  Procedures:  Antimicrobials:   Data Reviewed: I have personally reviewed following labs and imaging studies  CBC: No results for input(s): WBC, NEUTROABS, HGB, HCT, MCV, PLT in the last 168 hours. Basic Metabolic Panel: No results for input(s): NA, K, CL, CO2, GLUCOSE, BUN, CREATININE, CALCIUM, MG, PHOS in the last 168 hours. GFR: Estimated Creatinine Clearance: 51.6 mL/min (by C-G formula  based on SCr of 1.15 mg/dL). Liver Function Tests: No results for input(s): AST, ALT, ALKPHOS, BILITOT, PROT, ALBUMIN in the last 168 hours. No  results for input(s): LIPASE, AMYLASE in the last 168 hours. No results for input(s): AMMONIA in the last 168 hours. Coagulation Profile: No results for input(s): INR, PROTIME in the last 168 hours. Cardiac Enzymes: No results for input(s): CKTOTAL, CKMB, CKMBINDEX, TROPONINI in the last 168 hours. BNP (last 3 results) No results for input(s): PROBNP in the last 8760 hours. HbA1C: No results for input(s): HGBA1C in the last 72 hours. CBG: No results for input(s): GLUCAP in the last 168 hours. Lipid Profile: No results for input(s): CHOL, HDL, LDLCALC, TRIG, CHOLHDL, LDLDIRECT in the last 72 hours. Thyroid Function Tests: No results for input(s): TSH, T4TOTAL, FREET4, T3FREE, THYROIDAB in the last 72 hours. Anemia Panel: No results for input(s): VITAMINB12, FOLATE, FERRITIN, TIBC, IRON, RETICCTPCT in the last 72 hours. Sepsis Labs: No results for input(s): PROCALCITON, LATICACIDVEN in the last 168 hours.  No results found for this or any previous visit (from the past 240 hour(s)).   Radiology Studies: No results found.  Scheduled Meds: . dexamethasone  2 mg Oral BID  . sodium chloride flush  3 mL Intravenous Q12H   Continuous Infusions:   LOS: 15 days   Time spent: 30 minutes  Lorella Nimrod, MD [facility] Pager [phone]  If 7PM-7AM, please contact night-coverage [email or web address] Password [facility] 12/10/2019, 3:37 PM   This record has been created using Dragon voice recognition software. Errors have been sought and corrected,but may not always be located. Such creation errors do not reflect on the standard of care.

## 2019-12-11 NOTE — Progress Notes (Signed)
PROGRESS NOTE    Bryan Buck  [id number] DOB: [birth year] DOA: 11/25/2019  PCP: Kirk Ruths, MD    LOS - 16   Brief Narrative:  85 y.o.malewith medical history significant ofcognitive decline, questionable Alzheimer's disease, COPD, diabetes mellitus type 2, pernicious anemia, hyperlipidemia, type 2 diabetes mellitus, pancytopenia in the past, hypertension was brought in by EMS after brother found him with altered mental status (not acting himself) and had apparent slurring or abnormal speech.  CT of the head revealed vasogenic edema in the left temporoparietal lobe with question of underlying mass measuring 3.6.  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  Neurosurgery was consulted-not a good candidate for further intervention and family decided towards comfort measures.  Patient also found with COVID-19 positive on admission.  Has not required any supplemental oxygen  Subjective 12/16: Patient awake sitting in bed when seen this AM.  No acute events reported overnight.  Patient reports overall feeling well.  No cough, fever, chills or chest pain.  Says he feels weak generally, wants to get up to walk.    Assessment & Plan:   Active Problems:   Type 2 diabetes mellitus with hyperlipidemia (HCC)   Mental status alteration   History of pancytopenia   Cerebral edema (HCC)   Altered mental status.  Resolved. Most likely due to multiple brain masses with vasogenic edema.   Neurosurgery was consulted-appreciate their recommendations.   Not a candidate for any aggressive measures including chemotherapy or radiation. Mentation has improved and appears to be at his baseline.   Family wants to keep him comfortable-waiting for SNF placement with hospice care. -Continue Decadron 4 mg twice daily.   -Will need continuation of Decadron 2 mg twice daily p.o. on discharge. -Palliative care is following-appreciate their recommendations.  -Brother is  looking for a better place where SNF is available with ALF as patient cannot take care of himself and has no other family member available.  COVID-19 positive.  Not requiring any supplemental oxygen at this time. -Continue Decadron.   DVT prophylaxis: none, palliative/hospice   Code Status: DNR  Family Communication: none at bedside Disposition Plan:  To SNF, pending bed available.  Continue medical management as above.   Consultants:   None  Procedures:   Neurosurgery  Palliative Care  Antimicrobials:   None    Objective: Vitals:   12/10/19 0921 12/10/19 1646 12/11/19 0525 12/11/19 0746  BP: (!) 119/59 (!) 121/58 133/61 135/64  Pulse: 73 (!) 54 (!) 49 (!) 49  Resp: 18 18 18 15   Temp: 97.9 F (36.6 C) 97.7 F (36.5 C) (!) 97.4 F (36.3 C) (!) 97.4 F (36.3 C)  TempSrc: Axillary Oral Oral Oral  SpO2: 100% 98% 98% 98%  Weight:      Height:       No intake or output data in the 24 hours ending 12/11/19 1221 Filed Weights   11/28/19 0430 11/29/19 0311 12/10/19 0410  Weight: 77.7 kg 78.1 kg 75 kg    Examination:  General exam: awake, alert, no acute distress Respiratory system: clear to auscultation bilaterally, no wheezes, rales or rhonchi, normal respiratory effort. Cardiovascular system: normal S1/S2, RRR, no JVD, murmurs, rubs, gallops, no pedal edema.   Gastrointestinal system: soft, non-tender, non-distended abdomen Central nervous system: alert and oriented x4. no gross focal neurologic deficits, normal speech Skin: dry, intact, normal temperature Psychiatry: normal mood, congruent affect   Data Reviewed: I have personally reviewed following labs and imaging studies  CBC: No  results for input(s): WBC, NEUTROABS, HGB, HCT, MCV, PLT in the last 168 hours. Basic Metabolic Panel: No results for input(s): NA, K, CL, CO2, GLUCOSE, BUN, CREATININE, CALCIUM, MG, PHOS in the last 168 hours. GFR: Estimated Creatinine Clearance: 51.6 mL/min (by C-G formula  based on SCr of 1.15 mg/dL). Liver Function Tests: No results for input(s): AST, ALT, ALKPHOS, BILITOT, PROT, ALBUMIN in the last 168 hours. No results for input(s): LIPASE, AMYLASE in the last 168 hours. No results for input(s): AMMONIA in the last 168 hours. Coagulation Profile: No results for input(s): INR, PROTIME in the last 168 hours. Cardiac Enzymes: No results for input(s): CKTOTAL, CKMB, CKMBINDEX, TROPONINI in the last 168 hours. BNP (last 3 results) No results for input(s): PROBNP in the last 8760 hours. HbA1C: No results for input(s): HGBA1C in the last 72 hours. CBG: No results for input(s): GLUCAP in the last 168 hours. Lipid Profile: No results for input(s): CHOL, HDL, LDLCALC, TRIG, CHOLHDL, LDLDIRECT in the last 72 hours. Thyroid Function Tests: No results for input(s): TSH, T4TOTAL, FREET4, T3FREE, THYROIDAB in the last 72 hours. Anemia Panel: No results for input(s): VITAMINB12, FOLATE, FERRITIN, TIBC, IRON, RETICCTPCT in the last 72 hours. Sepsis Labs: No results for input(s): PROCALCITON, LATICACIDVEN in the last 168 hours.  No results found for this or any previous visit (from the past 240 hour(s)).       Radiology Studies: No results found.      Scheduled Meds: . dexamethasone  2 mg Oral BID  . sodium chloride flush  3 mL Intravenous Q12H   Continuous Infusions:   LOS: 16 days    Time spent: 30-35 minutes    Ezekiel Slocumb, DO [facility] Pager: [phone]  If 7PM-7AM, please contact night-coverage [email or web address] Password [email or web address] 12/11/2019, 12:21 PM

## 2019-12-11 NOTE — TOC Progression Note (Addendum)
Transition of Care [facility]) - Progression Note    Patient Details  Name: Bryan Buck MRN: [id number] Date of Birth: 22-Oct-1934  Transition of Care [facility]) CM/SW [place], LCSW Phone Number: 12/11/2019, 11:46 AM  Clinical Narrative: Left voicemail for [facility] to see if they reviewed the referral faxed over yesterday. [facility] does not have a bed today.    12:50 pm: [facility] unable to offer a bed. [facility] has not received answer from nursing/administration so she will check on that and call back. No call back from [facility].  3:15 pm: [facility] hasn't gotten an answer yet. Admissions coordinator will check again and call CSW back. [facility] does not have any COVID beds available.  4:11 pm: Spoke with patient's brother. He wants CSW to check [facility] (No beds), [facility] (No beds), [facility] (No beds), and [facility] (Not accepting admissions at this time).   Expected Discharge Plan: (Unsure) Barriers to Discharge: Continued Medical Work up  Expected Discharge Plan and Services Expected Discharge Plan: (Unsure) In-house Referral: Clinical Social Work   Post Acute Care Choice: (error) Living arrangements for the past 2 months: [place]: PT, OT HH Agency: [facility] Date [place]: 12/08/19 Time [place]: 1217 Representative spoke with at [place]: [place]  and [place] Determinants of Health ([place]) Interventions    Readmission Risk Interventions No flowsheet data found.

## 2019-12-12 LAB — GLUCOSE, CAPILLARY: Glucose-Capillary: 158 mg/dL — ABNORMAL HIGH (ref 70–99)

## 2019-12-12 NOTE — Progress Notes (Signed)
PROGRESS NOTE    Bryan Buck  [id number] DOB: November 17, 1934 DOA: 11/25/2019  PCP: Kirk Ruths, MD    LOS - 17   Brief Narrative:  85 y.o.malewith medical history significant ofcognitive decline, questionable Alzheimer's disease, COPD, diabetes mellitus type 2, pernicious anemia, hyperlipidemia, type 2 diabetes mellitus, pancytopenia in the past, hypertension was brought in by EMS after brother found him with altered mental status (not acting himself) and had apparent slurring or abnormal speech.  CT of the head revealed vasogenic edema in the left temporoparietal lobe with question of underlying mass measuring 3.6. 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  Neurosurgery was consulted-not a good candidate for further intervention and family decided towards comfort measures.  Patient also found with COVID-19 positive onadmission.  Has not required any supplemental oxygen.  Subjective 12/17: Patient seen this AM, sleeping comfortably but awoke to voice.  Denies any complaints including fever/chills, chest pain, SOB, N/V/D.  No acute events reported.  Assessment & Plan:   Active Problems:   Type 2 diabetes mellitus with hyperlipidemia (HCC)   Mental status alteration   History of pancytopenia   Cerebral edema (HCC)   Altered mental status.Resolved. Most likely due to multiple brain masses with vasogenic edema.   Neurosurgery was consulted-appreciate their recommendations.   Not a candidate for any aggressive measures including chemotherapy or radiation. Mentation has improved and appears to be at his baseline.   Family wants to keep him comfortable-waiting for SNF placement with hospice care. -Continue Decadron 4 mg twice daily.  -Will need continuation of Decadron 2 mg twice daily p.o. on discharge. -Palliative care is following-appreciate their recommendations.  -Brother is looking for a better place where SNF is available withALF as  patient cannot take care of himself and has no other family member available.  COVID-19 positive.Not requiring any supplemental oxygen at this time. -Continue Decadron.   DVT prophylaxis: none, palliative/hospice   Code Status: DNR  Family Communication: none at bedside Disposition Plan:  To SNF, pending bed available.  Continue medical management as above.   Consultants:   None  Procedures:   Neurosurgery  Palliative Care  Antimicrobials:   None    Objective: Vitals:   12/11/19 0746 12/11/19 1517 12/11/19 1931 12/12/19 0804  BP: 135/64 126/65 130/60 139/72  Pulse: (!) 49 (!) 59 62 (!) 50  Resp: 15 16 20 18   Temp: (!) 97.4 F (36.3 C) 98 F (36.7 C) 98 F (36.7 C) 98.4 F (36.9 C)  TempSrc: Oral Oral Oral   SpO2: 98% 97% 100% 97%  Weight:      Height:       No intake or output data in the 24 hours ending 12/12/19 1410 Filed Weights   11/28/19 0430 11/29/19 0311 12/10/19 0410  Weight: 77.7 kg 78.1 kg 75 kg    Examination:  General exam: resting comfortably, no acute distress Respiratory system: clear to auscultation bilaterally, no wheezes, rales or rhonchi, normal respiratory effort. Cardiovascular system: normal S1/S2,  RRR, no JVD, murmurs, rubs, gallops,  no pedal edema.   Central nervous system: alert and oriented x4. no gross focal neurologic deficits, normal speech Extremities: moves all, no edema, normal tone    Data Reviewed: I have personally reviewed following labs and imaging studies  CBC: No results for input(s): WBC, NEUTROABS, HGB, HCT, MCV, PLT in the last 168 hours. Basic Metabolic Panel: No results for input(s): NA, K, CL, CO2, GLUCOSE, BUN, CREATININE, CALCIUM, MG, PHOS in the last 168  hours. GFR: Estimated Creatinine Clearance: 51.6 mL/min (by C-G formula based on SCr of 1.15 mg/dL). Liver Function Tests: No results for input(s): AST, ALT, ALKPHOS, BILITOT, PROT, ALBUMIN in the last 168 hours. No results for input(s):  LIPASE, AMYLASE in the last 168 hours. No results for input(s): AMMONIA in the last 168 hours. Coagulation Profile: No results for input(s): INR, PROTIME in the last 168 hours. Cardiac Enzymes: No results for input(s): CKTOTAL, CKMB, CKMBINDEX, TROPONINI in the last 168 hours. BNP (last 3 results) No results for input(s): PROBNP in the last 8760 hours. HbA1C: No results for input(s): HGBA1C in the last 72 hours. CBG: Recent Labs  Lab 12/12/19 0803  GLUCAP 158*   Lipid Profile: No results for input(s): CHOL, HDL, LDLCALC, TRIG, CHOLHDL, LDLDIRECT in the last 72 hours. Thyroid Function Tests: No results for input(s): TSH, T4TOTAL, FREET4, T3FREE, THYROIDAB in the last 72 hours. Anemia Panel: No results for input(s): VITAMINB12, FOLATE, FERRITIN, TIBC, IRON, RETICCTPCT in the last 72 hours. Sepsis Labs: No results for input(s): PROCALCITON, LATICACIDVEN in the last 168 hours.  No results found for this or any previous visit (from the past 240 hour(s)).       Radiology Studies: No results found.      Scheduled Meds: . dexamethasone  2 mg Oral BID  . sodium chloride flush  3 mL Intravenous Q12H   Continuous Infusions:   LOS: 17 days    Time spent: 15-20 minutes    Ezekiel Slocumb, DO [facility] Pager: [phone]  If 7PM-7AM, please contact night-coverage [email or web address] Password [email or web address] 12/12/2019, 2:10 PM

## 2019-12-12 NOTE — Plan of Care (Signed)
  Problem: Clinical Measurements: Goal: Respiratory complications will improve Outcome: Progressing Goal: Cardiovascular complication will be avoided Outcome: Progressing   Problem: Activity: Goal: Risk for activity intolerance will decrease Outcome: Progressing   Problem: Elimination: Goal: Will not experience complications related to urinary retention Outcome: Progressing   Problem: Pain Managment: Goal: General experience of comfort will improve Outcome: Progressing   Problem: Safety: Goal: Ability to remain free from injury will improve Outcome: Progressing   Problem: Clinical Measurements: Goal: Quality of life will improve Outcome: Progressing   Problem: Respiratory: Goal: Verbalizations of increased ease of respirations will increase Outcome: Progressing   Problem: Pain Management: Goal: Satisfaction with pain management regimen will improve Outcome: Progressing   Problem: Respiratory: Goal: Will maintain a patent airway Outcome: Progressing Goal: Complications related to the disease process, condition or treatment will be avoided or minimized Outcome: Progressing

## 2019-12-12 NOTE — TOC Progression Note (Signed)
Transition of Care [facility]) - Progression Note    Patient Details  Name: Bryan Buck MRN: [id number] Date of Birth: Sep 23, 1934  Transition of Care [facility]) CM/SW [place], LCSW Phone Number: 12/12/2019, 4:15 PM  Clinical Narrative: [facility] unable to accept patient at this time due to a new COVID outbreak. No bed at [facility] today.  Expected Discharge Plan: (Unsure) Barriers to Discharge: Continued Medical Work up  Expected Discharge Plan and Services Expected Discharge Plan: (Unsure) In-house Referral: Clinical Social Work   Post Acute Care Choice: (error) Living arrangements for the past 2 months: [place]: PT, OT HH Agency: [facility] Date [place]: 12/08/19 Time [place]: 1217 Representative spoke with at [place]: [place]  and [place] Determinants of Health ([place]) Interventions    Readmission Risk Interventions No flowsheet data found.

## 2019-12-13 NOTE — TOC Progression Note (Signed)
Transition of Care [facility]) - Progression Note    Patient Details  Name: Bryan Buck MRN: [id number] Date of Birth: 08/02/1934  Transition of Care [facility]) CM/SW [place], LCSW Phone Number: 12/13/2019, 3:39 PM  Clinical Narrative: [name] does not have a bed and likely won't have one over the weekend. [facility] is reviewing referral but it will cost $350 per day (x30 days = $10,500).    Expected Discharge Plan: (Unsure) Barriers to Discharge: Continued Medical Work up  Expected Discharge Plan and Services Expected Discharge Plan: (Unsure) In-house Referral: Clinical Social Work   Post Acute Care Choice: (error) Living arrangements for the past 2 months: [place]: PT, OT HH Agency: [facility] Date [place]: 12/08/19 Time [place]: 1217 Representative spoke with at [place]: [place]  and [place] Determinants of Health ([place]) Interventions    Readmission Risk Interventions No flowsheet data found.

## 2019-12-13 NOTE — Progress Notes (Signed)
PROGRESS NOTE    Bryan Buck  [id number] DOB: June 19, 1934 DOA: 11/25/2019  PCP: Kirk Ruths, MD    LOS - 18   Brief Narrative:  85 y.o.malewith medical history significant ofcognitive decline, questionable Alzheimer's disease, COPD, diabetes mellitus type 2, pernicious anemia, hyperlipidemia, type 2 diabetes mellitus, pancytopenia in the past, hypertension was brought in by EMS after brother found himwith altered mental status (not acting himself) andhad apparent slurring or abnormalspeech. CT of the head revealed vasogenic edema in the left temporoparietal lobe with question of underlying mass measuring 3.6. 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 Neurosurgery was consulted-not a good candidate for further intervention and family decided towards comfort measures. Patient also found with COVID-19 positive onadmission.Has notrequired any supplementaloxygen.  Subjective 12/18: Patient awake laying bed when seen this morning.  No acute events reported overnight.  Patient reports feeling fine has no complaints denies fevers or chills, chest pain or shortness of breath.  Assessment & Plan:   Active Problems:   Type 2 diabetes mellitus with hyperlipidemia (HCC)   Mental status alteration   History of pancytopenia   Cerebral edema (HCC)   Altered mental status.Resolved. Most likely due to multiple brain masseswithvasogenic edema. Neurosurgery was consulted-appreciate their recommendations. Not a candidate for any aggressive measures including chemotherapy or radiation. Mentation has improved and appears to be at his baseline. Family wants to keep him comfortable-waiting for SNF placement with hospice care. -Continue Decadron 4 mg twice daily. -Will need continuation of Decadron 2 mg twice daily p.o. on discharge. -monitor neuro/mental status -Palliative care is following-appreciate their recommendations.  -Brother is  looking for a better place where SNF is available withALF as patient cannot take care of himself and has no other family member available.  COVID-19 positive.Not requiring any supplemental oxygen at this time. -Continue Decadron.   DVT prophylaxis:none, palliative/hospice Code Status: DNR Family Communication:none at bedside Disposition Plan:To SNF, pending bed available. Continue medical management and monitoring of neurologic status as above.   Consultants:  None  Procedures:  Neurosurgery  Palliative Care  Antimicrobials:  None    Objective: Vitals:   12/12/19 0804 12/12/19 2026 12/13/19 0709 12/13/19 1504  BP: 139/72 138/73 131/67 (!) 134/57  Pulse: (!) 50 68 (!) 52 66  Resp: 18 20 15 17   Temp: 98.4 F (36.9 C) (!) 97.5 F (36.4 C) 97.7 F (36.5 C) 97.7 F (36.5 C)  TempSrc:  Oral Oral Oral  SpO2: 97% 99% 96% 97%  Weight:      Height:        Intake/Output Summary (Last 24 hours) at 12/13/2019 1623 Last data filed at 12/13/2019 0400 Gross per 24 hour  Intake -  Output 0 ml  Net 0 ml   Filed Weights   11/28/19 0430 11/29/19 0311 12/10/19 0410  Weight: 77.7 kg 78.1 kg 75 kg    Examination:  General exam: awake, alert, no acute distress Respiratory system: clear to auscultation bilaterally, no wheezes, rales or rhonchi, normal respiratory effort. Cardiovascular system: normal S1/S2, RRR, no JVD, murmurs, rubs, gallops, no pedal edema.   Central nervous system: alert and oriented x3. no gross focal neurologic deficits, normal speech   Data Reviewed: I have personally reviewed following labs and imaging studies  CBC: No results for input(s): WBC, NEUTROABS, HGB, HCT, MCV, PLT in the last 168 hours. Basic Metabolic Panel: No results for input(s): NA, K, CL, CO2, GLUCOSE, BUN, CREATININE, CALCIUM, MG, PHOS in the last 168 hours. GFR: Estimated Creatinine Clearance:  51.6 mL/min (by C-G formula based on SCr of 1.15 mg/dL).  Liver Function Tests: No results for input(s): AST, ALT, ALKPHOS, BILITOT, PROT, ALBUMIN in the last 168 hours. No results for input(s): LIPASE, AMYLASE in the last 168 hours. No results for input(s): AMMONIA in the last 168 hours. Coagulation Profile: No results for input(s): INR, PROTIME in the last 168 hours. Cardiac Enzymes: No results for input(s): CKTOTAL, CKMB, CKMBINDEX, TROPONINI in the last 168 hours. BNP (last 3 results) No results for input(s): PROBNP in the last 8760 hours. HbA1C: No results for input(s): HGBA1C in the last 72 hours. CBG: Recent Labs  Lab 12/12/19 0803  GLUCAP 158*   Lipid Profile: No results for input(s): CHOL, HDL, LDLCALC, TRIG, CHOLHDL, LDLDIRECT in the last 72 hours. Thyroid Function Tests: No results for input(s): TSH, T4TOTAL, FREET4, T3FREE, THYROIDAB in the last 72 hours. Anemia Panel: No results for input(s): VITAMINB12, FOLATE, FERRITIN, TIBC, IRON, RETICCTPCT in the last 72 hours. Sepsis Labs: No results for input(s): PROCALCITON, LATICACIDVEN in the last 168 hours.  No results found for this or any previous visit (from the past 240 hour(s)).       Radiology Studies: No results found.      Scheduled Meds: . dexamethasone  2 mg Oral BID  . sodium chloride flush  3 mL Intravenous Q12H   Continuous Infusions:   LOS: 18 days    Time spent: 15-20 minutes    Ezekiel Slocumb, DO [facility] Pager: [phone]  If 7PM-7AM, please contact night-coverage [email or web address] Password [facility] 12/13/2019, 4:23 PM

## 2019-12-14 NOTE — Progress Notes (Signed)
PROGRESS NOTE    Bryan Buck  [id number] DOB: 01-Dec-1934 DOA: 11/25/2019  PCP: Kirk Ruths, MD    LOS - 19   Brief Narrative:  85 y.o.malewith medical history significant ofcognitive decline, questionable Alzheimer's disease, COPD, diabetes mellitus type 2, pernicious anemia, hyperlipidemia, type 2 diabetes mellitus, pancytopenia in the past, hypertension was brought in by EMS after brother found himwith altered mental status (not acting himself) andhad apparent slurring or abnormalspeech. CT of the head revealed vasogenic edema in the left temporoparietal lobe with question of underlying mass measuring 3.6. 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 Neurosurgery was consulted-not a good candidate for further intervention and family decided towards comfort measures. Patient also found with COVID-19 positive onadmission.Has notrequired any supplementaloxygen, has essentially been asymptomatic.  Subjective 12/19: Patient resting comfortably when seen this morning.  No acute events reported overnight.  Patient reports feeling well, says he has no complaints.  Specifically denies fevers, chills, chest pain, shortness of breath.  Assessment & Plan:   Active Problems:   Type 2 diabetes mellitus with hyperlipidemia (HCC)   Mental status alteration   History of pancytopenia   Cerebral edema (HCC)   Altered mental status.Resolved. Most likely due to multiple brain masseswithvasogenic edema. Neurosurgery was consulted-appreciate their recommendations. Not a candidate for any aggressive measures including chemotherapy or radiation. Mentation has improved and appears to be at his baseline. Family wants to keep him comfortable-waiting for SNF placement with hospice care. -Continue Decadron 4 mg twice daily. -Will need continuation of Decadron 2 mg twice daily p.o. on discharge. -monitor neuro/mental status -Palliative care is  following-appreciate their recommendations.  -Brother is looking for a better place where SNF is available withALF as patient cannot take care of himself and has no other family member available.  COVID-19 positive.Not requiring any supplemental oxygen at this time. -Continue Decadron.   DVT prophylaxis:none, palliative/hospice Code Status: DNR Family Communication:none at bedside Disposition Plan:To SNF, pending bed available. Continue medical management and monitoring of neurologic status as above.   Consultants:  None  Procedures:  Neurosurgery  Palliative Care  Antimicrobials:  None   Objective: Vitals:   12/13/19 1504 12/13/19 2032 12/14/19 0519 12/14/19 0813  BP: (!) 134/57 (!) 142/64 (!) 127/56 125/64  Pulse: 66 64 (!) 51 (!) 52  Resp: 17 16 20 18   Temp: 97.7 F (36.5 C) (!) 97.4 F (36.3 C) 97.8 F (36.6 C) 97.7 F (36.5 C)  TempSrc: Oral Oral Oral Oral  SpO2: 97% 98% 97% 98%  Weight:   79.1 kg   Height:       No intake or output data in the 24 hours ending 12/14/19 0837 Filed Weights   11/29/19 0311 12/10/19 0410 12/14/19 0519  Weight: 78.1 kg 75 kg 79.1 kg    Examination:  General exam: awake, alert, no acute distress, underweight Respiratory system: clear to auscultation bilaterally, no wheezes, rales or rhonchi, normal respiratory effort. Cardiovascular system: normal S1/S2, RRR, no pedal edema.   Central nervous system: alert and oriented x4. no gross focal neurologic deficits, normal speech Extremities: moves all, no cyanosis, normal tone    Data Reviewed: I have personally reviewed following labs and imaging studies  CBC: No results for input(s): WBC, NEUTROABS, HGB, HCT, MCV, PLT in the last 168 hours. Basic Metabolic Panel: No results for input(s): NA, K, CL, CO2, GLUCOSE, BUN, CREATININE, CALCIUM, MG, PHOS in the last 168 hours. GFR: Estimated Creatinine Clearance: 53.4 mL/min (by C-G formula based on SCr of  1.15 mg/dL). Liver Function Tests: No results for input(s): AST, ALT, ALKPHOS, BILITOT, PROT, ALBUMIN in the last 168 hours. No results for input(s): LIPASE, AMYLASE in the last 168 hours. No results for input(s): AMMONIA in the last 168 hours. Coagulation Profile: No results for input(s): INR, PROTIME in the last 168 hours. Cardiac Enzymes: No results for input(s): CKTOTAL, CKMB, CKMBINDEX, TROPONINI in the last 168 hours. BNP (last 3 results) No results for input(s): PROBNP in the last 8760 hours. HbA1C: No results for input(s): HGBA1C in the last 72 hours. CBG: Recent Labs  Lab 12/12/19 0803  GLUCAP 158*   Lipid Profile: No results for input(s): CHOL, HDL, LDLCALC, TRIG, CHOLHDL, LDLDIRECT in the last 72 hours. Thyroid Function Tests: No results for input(s): TSH, T4TOTAL, FREET4, T3FREE, THYROIDAB in the last 72 hours. Anemia Panel: No results for input(s): VITAMINB12, FOLATE, FERRITIN, TIBC, IRON, RETICCTPCT in the last 72 hours. Sepsis Labs: No results for input(s): PROCALCITON, LATICACIDVEN in the last 168 hours.  No results found for this or any previous visit (from the past 240 hour(s)).       Radiology Studies: No results found.      Scheduled Meds: . dexamethasone  2 mg Oral BID  . sodium chloride flush  3 mL Intravenous Q12H   Continuous Infusions:   LOS: 19 days    Time spent: 15-20 minutes    Ezekiel Slocumb, DO [facility] Pager: [phone]  If 7PM-7AM, please contact night-coverage [email or web address] Password [facility] 12/14/2019, 8:37 AM

## 2019-12-14 NOTE — Plan of Care (Signed)
  Problem: Coping: Goal: Ability to identify and develop effective coping behavior will improve Outcome: Progressing   Problem: Clinical Measurements: Goal: Quality of life will improve Outcome: Progressing   Problem: Education: Goal: Knowledge of risk factors and measures for prevention of condition will improve Outcome: Progressing

## 2019-12-15 NOTE — Plan of Care (Signed)
  Problem: Safety: Goal: Ability to remain free from injury will improve Outcome: Progressing   Problem: Skin Integrity: Goal: Risk for impaired skin integrity will decrease Outcome: Progressing   

## 2019-12-15 NOTE — Plan of Care (Signed)
  Problem: Clinical Measurements: Goal: Will remain free from infection Outcome: Progressing Goal: Respiratory complications will improve Outcome: Progressing   Problem: Activity: Goal: Risk for activity intolerance will decrease Outcome: Progressing   Problem: Safety: Goal: Ability to remain free from injury will improve Outcome: Progressing   Problem: Respiratory: Goal: Verbalizations of increased ease of respirations will increase Outcome: Progressing

## 2019-12-15 NOTE — Progress Notes (Signed)
PROGRESS NOTE    Bryan Buck  [id number] DOB: 1934-02-20 DOA: 11/25/2019  PCP: Kirk Ruths, MD    LOS - 20   Brief Narrative:  85 y.o.malewith medical history significant ofcognitive decline, questionable Alzheimer's disease, COPD, diabetes mellitus type 2, pernicious anemia, hyperlipidemia, type 2 diabetes mellitus, pancytopenia in the past, hypertension was brought in by EMS after brother found himwith altered mental status (not acting himself) andhad apparent slurring or abnormalspeech. CT of the head revealed vasogenic edema in the left temporoparietal lobe with question of underlying mass measuring 3.6. 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 Neurosurgery was consulted-not a good candidate for further intervention and family decided towards comfort measures. Patient also found with COVID-19 positive onadmission.Has notrequired any supplementaloxygen, has essentially been asymptomatic.  Subjective 12/20: Patient sitting up edge of bed when seen this morning.  He denies any symptoms whatsoever, stating he feels just fine.  Specifically, no fevers or chills, chest pain or shortness of breath.  No acute events reported overnight.  Assessment & Plan:   Active Problems:   Type 2 diabetes mellitus with hyperlipidemia (HCC)   Mental status alteration   History of pancytopenia   Cerebral edema (HCC)   Altered mental status.Resolved. Most likely due to multiple brain masseswithvasogenic edema. Neurosurgery was consulted-appreciate their recommendations. Not a candidate for any aggressive measures including chemotherapy or radiation. Mentation has improved and appears to be at his baseline. Family wants to keep him comfortable-waiting for SNF placement with hospice care. -Continue Decadron 4 mg twice daily. -Will need continuation of Decadron 2 mg twice daily p.o. on discharge. -monitor neuro/mental status -Palliative  care is following-appreciate their recommendations.  -Brother is looking for a better place where SNF is available withALF as patient cannot take care of himself and has no other family member available.  COVID-19 positive.Not requiring any supplemental oxygen at this time. -on Decadron for above    DVT prophylaxis: none, palliative/hospice   Code Status: DNR  Family Communication: none at bedside  Disposition Plan:  To SNF pending bed available.  Continue medical management and monitor neurologic status closely as above.   Consultants:   Neurosurgery  Palliative  Procedures:   None  Antimicrobials:   None    Objective: Vitals:   12/14/19 0813 12/14/19 1640 12/15/19 0456 12/15/19 0821  BP: 125/64 134/64 (!) 126/55 136/65  Pulse: (!) 52 (!) 58 (!) 52 (!) 49  Resp: 18 18 18 19   Temp: 97.7 F (36.5 C) 97.8 F (36.6 C) 98 F (36.7 C) 97.7 F (36.5 C)  TempSrc: Oral Oral  Oral  SpO2: 98% 97% 98% 97%  Weight:      Height:        Intake/Output Summary (Last 24 hours) at 12/15/2019 0910 Last data filed at 12/14/2019 1845 Gross per 24 hour  Intake 560 ml  Output 850 ml  Net -290 ml   Filed Weights   11/29/19 0311 12/10/19 0410 12/14/19 0519  Weight: 78.1 kg 75 kg 79.1 kg    Examination:  General exam: awake, alert, no acute distress Respiratory system: clear to auscultation bilaterally, no wheezes, rales or rhonchi, normal respiratory effort. Cardiovascular system: normal S1/S2, RRR, no JVD, murmurs, rubs, gallops, no pedal edema.   Central nervous system: alert and oriented x4. no gross focal neurologic deficits, normal speech Psychiatry: normal mood, congruent affect, judgement and insight appear normal    Data Reviewed: I have personally reviewed following labs and imaging studies  CBC: No results for  input(s): WBC, NEUTROABS, HGB, HCT, MCV, PLT in the last 168 hours. Basic Metabolic Panel: No results for input(s): NA, K, CL, CO2, GLUCOSE, BUN,  CREATININE, CALCIUM, MG, PHOS in the last 168 hours. GFR: Estimated Creatinine Clearance: 53.4 mL/min (by C-G formula based on SCr of 1.15 mg/dL). Liver Function Tests: No results for input(s): AST, ALT, ALKPHOS, BILITOT, PROT, ALBUMIN in the last 168 hours. No results for input(s): LIPASE, AMYLASE in the last 168 hours. No results for input(s): AMMONIA in the last 168 hours. Coagulation Profile: No results for input(s): INR, PROTIME in the last 168 hours. Cardiac Enzymes: No results for input(s): CKTOTAL, CKMB, CKMBINDEX, TROPONINI in the last 168 hours. BNP (last 3 results) No results for input(s): PROBNP in the last 8760 hours. HbA1C: No results for input(s): HGBA1C in the last 72 hours. CBG: Recent Labs  Lab 12/12/19 0803  GLUCAP 158*   Lipid Profile: No results for input(s): CHOL, HDL, LDLCALC, TRIG, CHOLHDL, LDLDIRECT in the last 72 hours. Thyroid Function Tests: No results for input(s): TSH, T4TOTAL, FREET4, T3FREE, THYROIDAB in the last 72 hours. Anemia Panel: No results for input(s): VITAMINB12, FOLATE, FERRITIN, TIBC, IRON, RETICCTPCT in the last 72 hours. Sepsis Labs: No results for input(s): PROCALCITON, LATICACIDVEN in the last 168 hours.  No results found for this or any previous visit (from the past 240 hour(s)).       Radiology Studies: No results found.      Scheduled Meds: . dexamethasone  2 mg Oral BID  . sodium chloride flush  3 mL Intravenous Q12H   Continuous Infusions:   LOS: 20 days    Time spent: 15-20 minutes    Ezekiel Slocumb, DO [facility]   If 7PM-7AM, please contact night-coverage [email or web address] Password [email or web address] 12/15/2019, 9:10 AM

## 2019-12-16 ENCOUNTER — Emergency Department
Admission: EM | Admit: 2019-12-16 | Discharge: 2020-01-15 | Disposition: A | Discharge status: Home / Self Care | Payer: Medicare Other | Attending: Emergency Medicine | Admitting: Emergency Medicine

## 2019-12-16 ENCOUNTER — Emergency Department: Payer: Medicare Other

## 2019-12-16 ENCOUNTER — Encounter: Payer: Self-pay | Admitting: Emergency Medicine

## 2019-12-16 ENCOUNTER — Other Ambulatory Visit: Payer: Self-pay

## 2019-12-16 DIAGNOSIS — C7931 Secondary malignant neoplasm of brain: Secondary | ICD-10-CM | POA: Insufficient documentation

## 2019-12-16 DIAGNOSIS — F0280 Dementia in other diseases classified elsewhere without behavioral disturbance: Secondary | ICD-10-CM | POA: Diagnosis present

## 2019-12-16 DIAGNOSIS — G936 Cerebral edema: Secondary | ICD-10-CM | POA: Insufficient documentation

## 2019-12-16 DIAGNOSIS — G9340 Encephalopathy, unspecified: Secondary | ICD-10-CM

## 2019-12-16 DIAGNOSIS — Z87891 Personal history of nicotine dependence: Secondary | ICD-10-CM | POA: Insufficient documentation

## 2019-12-16 DIAGNOSIS — F0281 Dementia in other diseases classified elsewhere with behavioral disturbance: Secondary | ICD-10-CM | POA: Insufficient documentation

## 2019-12-16 DIAGNOSIS — U071 COVID-19: Secondary | ICD-10-CM | POA: Insufficient documentation

## 2019-12-16 DIAGNOSIS — K7200 Acute and subacute hepatic failure without coma: Secondary | ICD-10-CM | POA: Diagnosis not present

## 2019-12-16 DIAGNOSIS — N390 Urinary tract infection, site not specified: Secondary | ICD-10-CM | POA: Insufficient documentation

## 2019-12-16 DIAGNOSIS — Z79899 Other long term (current) drug therapy: Secondary | ICD-10-CM | POA: Insufficient documentation

## 2019-12-16 DIAGNOSIS — E119 Type 2 diabetes mellitus without complications: Secondary | ICD-10-CM | POA: Insufficient documentation

## 2019-12-16 DIAGNOSIS — G309 Alzheimer's disease, unspecified: Secondary | ICD-10-CM | POA: Insufficient documentation

## 2019-12-16 MED ORDER — BIOTENE DRY MOUTH MT LIQD: 15.0000 mL | OROMUCOSAL | Status: AC | PRN

## 2019-12-16 MED ORDER — POLYVINYL ALCOHOL 1.4 % OP SOLN: 1.0000 [drp] | Freq: Four times a day (QID) | OPHTHALMIC | 0 refills | Status: AC | PRN

## 2019-12-16 MED ORDER — DEXAMETHASONE 2 MG PO TABS: 2.0000 mg | ORAL_TABLET | Freq: Two times a day (BID) | ORAL | 0 refills | Status: AC

## 2019-12-16 MED ORDER — MORPHINE SULFATE (CONCENTRATE) 10 MG/0.5ML PO SOLN: 10.0000 mg | ORAL | 0 refills | Status: AC | PRN

## 2019-12-16 MED ORDER — HALOPERIDOL LACTATE 5 MG/ML IJ SOLN
5.0000 mg | Freq: Once | INTRAMUSCULAR | Status: AC
  Administered 2019-12-16: 5 mg via INTRAMUSCULAR
  Filled 2019-12-16: qty 1

## 2019-12-16 MED ORDER — ALBUTEROL SULFATE HFA 108 (90 BASE) MCG/ACT IN AERS: 2.0000 | INHALATION_SPRAY | Freq: Four times a day (QID) | RESPIRATORY_TRACT | Status: AC | PRN

## 2019-12-16 MED ORDER — LORAZEPAM 2 MG/ML IJ SOLN
1.0000 mg | Freq: Once | INTRAMUSCULAR | Status: AC
  Administered 2019-12-16: 1 mg via INTRAMUSCULAR
  Filled 2019-12-16: qty 1

## 2019-12-16 NOTE — ED Notes (Addendum)
This RN contacted [employer]. 2x [phone]. No answer and unable to leave a voicemail. This RN will attempt to call again.

## 2019-12-16 NOTE — Progress Notes (Signed)
Iv  Removed for discharge.  Pt transferred to [facility]. Report called and given to stephanie at [facility]. Pt transported via ambulance accompanied by 2 EMTs. No distress noted. Pt belongings, money and credit cards discharged with him. Everything verified.

## 2019-12-16 NOTE — Plan of Care (Signed)
  Problem: Coping: Goal: Psychosocial and spiritual needs will be supported Outcome: Progressing   Problem: Respiratory: Goal: Will maintain a patent airway Outcome: Progressing   Problem: Education: Goal: Knowledge of risk factors and measures for prevention of condition will improve Outcome: Not Progressing   

## 2019-12-16 NOTE — ED Notes (Signed)
Pt sitting on edge of bed. EDT Wilfred Lacy at bedside assisting this RN. Pt given a sandwich tray/water by this RN.

## 2019-12-16 NOTE — TOC Transition Note (Signed)
Transition of Care [facility]) - CM/SW Discharge Note   Patient Details  Name: Bryan Buck MRN: [id number] Date of Birth: December 15, 1934  [facility]) CM/SW Contact:  Candie Chroman, LCSW Phone Number: 12/16/2019, 3:20 PM   Clinical Narrative:  Patient has orders to discharge to [facility] today. RN will call report to [phone] [facility]) prior to setting up EMS transport. No further concerns. CSW signing off.   Final next level of care: Skilled Nursing Facility Barriers to Discharge: Barriers Resolved   Patient Goals and CMS Choice Patient states their goals for this hospitalization and ongoing recovery are:: Patient not fully oriented.   Choice offered to / list presented to : Sibling  Discharge Placement PASRR number recieved: 12/03/19            Patient chooses bed at: [facility] Patient to be transferred to facility by: EMS Name of family member notified: Georffrey Fomby Patient and family notified of of transfer: 12/16/19  Discharge Plan and Services In-house Referral: Clinical Social Work   Post Acute Care Choice: (error)                    HH Arranged: PT, OT [place] Agency: [place] Date [place]: 12/08/19 Time [place]: 1217 Representative spoke with at [place]: [place]  and [employer]  Social Determinants of Health ([place]) Interventions     Readmission Risk Interventions No flowsheet data found.

## 2019-12-16 NOTE — ED Triage Notes (Signed)
Pt from [place] via [place]. Per EMS, pt DC from [facility] this morning and dx COVID +. Staff reports pt combative with staff. EMS VS: 70hr; 16rr; CBG 115; 97% RA. NAD noted upon arrival.

## 2019-12-16 NOTE — TOC Progression Note (Signed)
Transition of Care [facility]) - Progression Note    Patient Details  Name: Bryan Buck MRN: [id number] Date of Birth: [birth year]  Transition of Care [facility]) CM/SW [place], LCSW Phone Number: 12/16/2019, 1:52 PM  Clinical Narrative: Patient has a bed at [facility] today. They have contacted brother about paperwork. MD will start working on discharge paperwork shortly.    Expected Discharge Plan: (Unsure) Barriers to Discharge: Continued Medical Work up  Expected Discharge Plan and Services Expected Discharge Plan: (Unsure) In-house Referral: Clinical Social Work   Post Acute Care Choice: (error) Living arrangements for the past 2 months: [place]: PT, OT HH Agency: [place] Date [place]: 12/08/19 Time [place]: 1217 Representative spoke with at [place]: [place]  and [place] Determinants of Health ([place]) Interventions    Readmission Risk Interventions No flowsheet data found.

## 2019-12-16 NOTE — Discharge Summary (Signed)
Physician Discharge Summary  Bryan Buck [id number] DOB: May 07, 1934 DOA: 11/25/2019  PCP: Kirk Ruths, MD  Admit date: 11/25/2019 Discharge date: 12/16/2019  Admitted From: Home Disposition:  SNF, [facility]  Recommendations for Outpatient Follow-up:  1. Follow up with PCP in 1-2 weeks   Home Health: No Equipment/Devices: None   Discharge Condition: Stable  CODE STATUS: DNR  Diet recommendation: Regular   Brief/Interim Summary:  85 y.o.malewith medical history significant ofcognitive decline, questionable Alzheimer's disease, COPD, diabetes mellitus type 2, pernicious anemia, hyperlipidemia, type 2 diabetes mellitus, pancytopenia in the past, hypertension was brought in by EMS after brother found himwith altered mental status (not acting himself) andhad apparent slurring or abnormalspeech. CT of the head revealed vasogenic edema in the left temporoparietal lobe with question of underlying mass measuring 3.6. 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 Neurosurgery was consulted-not a good candidate for further intervention and family decided towards comfort measures. Patient also found with COVID-19 positive onadmission.Has notrequired any supplementaloxygen, has essentially been asymptomatic. Patient's neurologic status remained stable during admission.  He is stable for discharge to [facility] today.   Discharge Diagnoses: Active Problems:   Type 2 diabetes mellitus with hyperlipidemia (HCC)   Mental status alteration   History of pancytopenia   Cerebral edema [facility])    Discharge Instructions   Discharge Instructions    Call MD for:  severe uncontrolled pain   Complete by: As directed    Diet - low sodium heart healthy   Complete by: As directed    Increase activity slowly   Complete by: As directed      Allergies as of 12/16/2019   No Known Allergies     Medication List    STOP taking these medications    aspirin EC 81 MG tablet   metFORMIN 1000 MG tablet Commonly known as: GLUCOPHAGE   pravastatin 20 MG tablet Commonly known as: PRAVACHOL     TAKE these medications   acetaminophen 500 MG tablet Commonly known as: TYLENOL Take 500 mg by mouth every 6 (six) hours as needed for mild pain or fever.   albuterol 108 (90 Base) MCG/ACT inhaler Commonly known as: ProAir HFA Inhale 2 puffs into the lungs every 6 (six) hours as needed for wheezing or shortness of breath. What changed:   how much to take  when to take this  reasons to take this   antiseptic oral rinse Liqd Apply 15 mLs topically as needed for dry mouth.   dexamethasone 2 MG tablet Commonly known as: DECADRON Take 1 tablet (2 mg total) by mouth 2 (two) times daily.   morphine CONCENTRATE 10 MG/0.5ML Soln concentrated solution Take 0.5 mLs (10 mg total) by mouth every 2 (two) hours as needed for moderate pain, severe pain, anxiety or shortness of breath.   polyvinyl alcohol 1.4 % ophthalmic solution Commonly known as: LIQUIFILM TEARS Place 1 drop into both eyes 4 (four) times daily as needed for dry eyes.      Contact information for after-discharge care    Destination    [facility] .   Service: Skilled Nursing Contact information: [address] [place] [phone]             No Known Allergies  Consultations:  Neurosurgery  Palliative Care    Procedures/Studies: CT Head Wo Contrast  Result Date: 11/25/2019 CLINICAL DATA:  Altered level of consciousness. EXAM: CT HEAD WITHOUT CONTRAST TECHNIQUE: Contiguous axial images were obtained from  the base of the skull through the vertex without intravenous contrast. COMPARISON:  None. FINDINGS: Brain: There is a moderate to large volume of vasogenic edema in the left temporoparietal lobe. There are few associated hyperdense areas within this region. There is a mild rightward midline shift  measuring approximately 4-5 mm. There is some mass effect on the temporal horn of the left lateral ventricle. There is suggestion of a subtle underlying mass measuring approximately 3.6 cm. There is mild age related atrophy with chronic microvascular ischemic changes. Vascular: No hyperdense vessel or unexpected calcification. Skull: Normal. Negative for fracture or focal lesion. Sinuses/Orbits: No acute finding. Other: None. IMPRESSION: 1. There vasogenic edema in the left temporoparietal lobe with suggestion of a subtle underlying mass measuring approximately 3.6 cm. Further evaluation with MRI with and without contrast is recommended. 2. Small hyperdense areas at the periphery of this area of vasogenic edema likely representing a small volume of intra-axial blood products. 3. There is a mild rightward midline shift measuring 4-5 mm. These results were called by telephone at the time of interpretation on 11/25/2019 at 3:46 pm to provider [facility] ([facility] , who verbally acknowledged these results. Electronically Signed   By: Constance Holster M.D.   On: 11/25/2019 15:48   CT Chest W Contrast  Result Date: 11/26/2019 CLINICAL DATA:  Brain lesions on recent head CT and brain MRI suspicious for metastatic disease. Evaluate for possible primary tumor. EXAM: CT CHEST, ABDOMEN, AND PELVIS WITH CONTRAST TECHNIQUE: Multidetector CT imaging of the chest, abdomen and pelvis was performed following the standard protocol during bolus administration of intravenous contrast. CONTRAST:  140mL OMNIPAQUE IOHEXOL 300 MG/ML  SOLN COMPARISON:  None. FINDINGS: CT CHEST FINDINGS Cardiovascular: The heart is normal in size. No pericardial effusion. There is mild tortuosity and calcification of the thoracic aorta but no focal aneurysm or dissection. Scattered three-vessel coronary artery calcifications are noted. Mediastinum/Nodes: No mediastinal or hilar mass or adenopathy. The esophagus is grossly normal. Lungs/Pleura: Emphysematous  changes and pulmonary scarring. No acute pulmonary findings. No worrisome pulmonary lesions. No pulmonary nodules to suggest metastatic disease. No pleural effusions or pleural lesions. Musculoskeletal: No chest wall mass, supraclavicular or axillary lymphadenopathy. The bony thorax is intact. No worrisome bone lesions to suggest metastatic disease. CT ABDOMEN PELVIS FINDINGS Hepatobiliary: Scattered benign-appearing hepatic cysts. No worrisome hepatic lesions or intrahepatic biliary dilatation. Rim calcified gallstones are noted in the gallbladder. No common bile duct dilatation. Pancreas: No mass, inflammation or ductal dilatation. Spleen: Normal size.  No focal lesions. Adrenals/Urinary Tract: The adrenal glands and kidneys are unremarkable. No worrisome renal lesions or hydronephrosis. Thick-walled bladder likely due to partial bladder outlet obstruction. No bladder mass is identified. Stomach/Bowel: The stomach, duodenum, small bowel and colon are grossly normal without oral contrast. No acute inflammatory changes, mass lesions or obstructive findings. There are surgical changes in the right upper quadrant with partial right hemicolectomy. No masses identified. Vascular/Lymphatic: Advanced atherosclerotic calcifications involving the aorta and branch vessel ostia and iliac arteries but no aneurysm or dissection. The major venous structures are patent. Small scattered mesenteric and retroperitoneal lymph nodes but no mass or adenopathy. Reproductive: Enlarged cystic appearing seminal vesicles bilaterally with rim calcifications on the left. Prostate gland is mildly enlarged. Other: No pelvic mass or pelvic lymphadenopathy. No inguinal mass or adenopathy. Small bilateral inguinal hernias containing fat. Musculoskeletal: No significant bony findings. IMPRESSION: 1. No CT findings for primary neoplasm or metastatic disease in the chest, abdomen or pelvis. 2. Emphysematous changes but no acute  pulmonary findings or  worrisome pulmonary lesions. 3. Simple appearing hepatic cysts. 4. Evidence of prior partial right hemicolectomy. 5. Cholelithiasis. 6. No findings for osseous metastatic disease. Electronically Signed   By: Marijo Sanes M.D.   On: 11/26/2019 08:26   MR Brain W and Wo Contrast  Result Date: 11/25/2019 CLINICAL DATA:  85 year old male with altered mental status and abnormal noncontrast head CT earlier today demonstrating left hemisphere edema and mass effect. EXAM: MRI HEAD WITHOUT AND WITH CONTRAST TECHNIQUE: Multiplanar, multiecho pulse sequences of the brain and surrounding structures were obtained without and with intravenous contrast. CONTRAST:  13mL GADAVIST GADOBUTROL 1 MMOL/ML IV SOLN COMPARISON:  Head CT 1532 hours today.  Brain MRI 10/18/2014. FINDINGS: Brain: There are multiple enhancing masses affecting the left hemisphere. The largest lesion is a rim enhancing cystic, partially hemorrhagic mass centered in the posterior left temporal lobe measuring 41 millimeters diameter (series 15, image 13 and series 16, image 16). Anterior to the dominant mass is an adjacent solidly enhancing round 18 millimeter lesion (series 15, image 12) with mildly decreased T2 signal (series 10, image 12) and mildly restricted diffusion. In the nearby left deep gray matter nuclei cauda thalamic groove there is a similar round more solidly enhancing 14 millimeter mass (series 15, image 12). Furthermore there is another somewhat T2 hypointense oval enhancing mass occupying the left hippocampus measuring 18 millimeters (series 16, image 16 and series 12, image 16). That lesion is also somewhat restricted on diffusion. And finally, in the left middle cranial fossa there is an intra-axial versus extra-axial round 30 millimeter diameter solidly enhancing mass on series 15, image 7. That lesion seems to have adjacent dural thickening (same image). But no other dural thickening is identified throughout the brain. And on sagittal T1  images (series 13, image 16) the lesion appears more intra-axial. All of these are new since the 2015 MRI. There is subsequent abundant T2 and FLAIR hyperintensity resembling vasogenic edema, present throughout the left temporal lobe, tracking into the posterior deep white matter capsules and toward the superior left perirolandic cortex. Associated mass effect on the left lateral ventricle and 5 millimeters of rightward midline shift. Basilar cisterns remain patent. Conspicuous absence of lesions in the right hemisphere. No brainstem or posterior fossa involvement. Negative visible cervical spinal cord. No superimposed restricted diffusion suggestive of acute infarction. No ventriculomegaly, ependymal enhancement, or acute intracranial hemorrhage. Cervicomedullary junction and pituitary are within normal limits. Progressed mild white matter signal changes in the right hemisphere suggesting chronic small vessel disease. No cerebral blood products identified outside of the dominant enhancing mass. Vascular: Major intracranial vascular flow voids are stable since 2015. Dominant and tortuous right vertebral artery. The major dural venous sinuses seem to be patent. Skull and upper cervical spine: Visualized bone marrow signal is within normal limits. Sinuses/Orbits: Stable and negative orbits. Paranasal sinus mucosal thickening has regressed since 2015. Other: Mild chronic left mastoid effusion has not significantly changed. Visible internal auditory structures appear normal. Scalp and face soft tissues appear negative. IMPRESSION: 1. There is a heterogeneous population of Five enhancing brain masses limited to the left hemisphere. All are new from a 2015 brain MRI. They range from solid and 14 mm diameter to cystic/partially hemorrhagic measuring 41 mm diameter. Diffusion and T2 signal varies between the masses. And one of the lesions (left middle cranial fossa, 30 mm) might be extra-axial and seems to have a small area  of associated dural thickening. 2. Infiltrating, multifocal high-grade Glioma or Glioblastoma  is a possibility. CNS Lymphoma (particularly large B-cell type) is also a consideration. Metastatic disease is possible but multiple lesions this size limited to the left hemisphere would be highly unusual. Superimposed meningioma was considered for the left middle cranial fossa lesion, but not favored. Infectious or inflammatory etiology is felt unlikely, and the dominant cystic lesion does NOT have MRI characteristics of abscess. 3. Associated edema versus nonenhancing tumor in the left hemisphere with mass effect on the left lateral ventricle and rightward midline shift of 5 mm. Basilar cisterns remain patent and there is no ventriculomegaly. Electronically Signed   By: Genevie Ann M.D.   On: 11/25/2019 19:32   CT Abdomen Pelvis W Contrast  Result Date: 11/26/2019 CLINICAL DATA:  Brain lesions on recent head CT and brain MRI suspicious for metastatic disease. Evaluate for possible primary tumor. EXAM: CT CHEST, ABDOMEN, AND PELVIS WITH CONTRAST TECHNIQUE: Multidetector CT imaging of the chest, abdomen and pelvis was performed following the standard protocol during bolus administration of intravenous contrast. CONTRAST:  184mL OMNIPAQUE IOHEXOL 300 MG/ML  SOLN COMPARISON:  None. FINDINGS: CT CHEST FINDINGS Cardiovascular: The heart is normal in size. No pericardial effusion. There is mild tortuosity and calcification of the thoracic aorta but no focal aneurysm or dissection. Scattered three-vessel coronary artery calcifications are noted. Mediastinum/Nodes: No mediastinal or hilar mass or adenopathy. The esophagus is grossly normal. Lungs/Pleura: Emphysematous changes and pulmonary scarring. No acute pulmonary findings. No worrisome pulmonary lesions. No pulmonary nodules to suggest metastatic disease. No pleural effusions or pleural lesions. Musculoskeletal: No chest wall mass, supraclavicular or axillary  lymphadenopathy. The bony thorax is intact. No worrisome bone lesions to suggest metastatic disease. CT ABDOMEN PELVIS FINDINGS Hepatobiliary: Scattered benign-appearing hepatic cysts. No worrisome hepatic lesions or intrahepatic biliary dilatation. Rim calcified gallstones are noted in the gallbladder. No common bile duct dilatation. Pancreas: No mass, inflammation or ductal dilatation. Spleen: Normal size.  No focal lesions. Adrenals/Urinary Tract: The adrenal glands and kidneys are unremarkable. No worrisome renal lesions or hydronephrosis. Thick-walled bladder likely due to partial bladder outlet obstruction. No bladder mass is identified. Stomach/Bowel: The stomach, duodenum, small bowel and colon are grossly normal without oral contrast. No acute inflammatory changes, mass lesions or obstructive findings. There are surgical changes in the right upper quadrant with partial right hemicolectomy. No masses identified. Vascular/Lymphatic: Advanced atherosclerotic calcifications involving the aorta and branch vessel ostia and iliac arteries but no aneurysm or dissection. The major venous structures are patent. Small scattered mesenteric and retroperitoneal lymph nodes but no mass or adenopathy. Reproductive: Enlarged cystic appearing seminal vesicles bilaterally with rim calcifications on the left. Prostate gland is mildly enlarged. Other: No pelvic mass or pelvic lymphadenopathy. No inguinal mass or adenopathy. Small bilateral inguinal hernias containing fat. Musculoskeletal: No significant bony findings. IMPRESSION: 1. No CT findings for primary neoplasm or metastatic disease in the chest, abdomen or pelvis. 2. Emphysematous changes but no acute pulmonary findings or worrisome pulmonary lesions. 3. Simple appearing hepatic cysts. 4. Evidence of prior partial right hemicolectomy. 5. Cholelithiasis. 6. No findings for osseous metastatic disease. Electronically Signed   By: Marijo Sanes M.D.   On: 11/26/2019 08:26    DG Chest Portable 1 View  Result Date: 11/25/2019 CLINICAL DATA:  Altered mental status. EXAM: PORTABLE CHEST 1 VIEW COMPARISON:  CT chest 06/16/2008.  Chest x-ray 06/16/2008. FINDINGS: Mediastinum and hilar structures normal. Faint solitary nodular opacities noted over both lung bases most consistent nipple shadows. Lungs are clear acute infiltrates. No pleural effusion or  pneumothorax. Heart size normal. No pulmonary vascular congestion. Degenerative change thoracic spine. IMPRESSION: No acute cardiopulmonary disease. Electronically Signed   By: Marcello Moores  Register   On: 11/25/2019 15:52       Subjective: Patient sleeping comfortably but awoke to voice.  He reports feeling fine, just tired today.  No headache, vision changes or other complaints.  No fever/chils, SOB.  No acute events reported.    Discharge Exam: Vitals:   12/15/19 1957 12/16/19 0817  BP: 127/61 131/61  Pulse: 66 (!) 48  Resp:  18  Temp: 98.4 F (36.9 C) 98 F (36.7 C)  SpO2: 97% 97%   Vitals:   12/15/19 0456 12/15/19 0821 12/15/19 1957 12/16/19 0817  BP: (!) 126/55 136/65 127/61 131/61  Pulse: (!) 52 (!) 49 66 (!) 48  Resp: 18 19  18   Temp: 98 F (36.7 C) 97.7 F (36.5 C) 98.4 F (36.9 C) 98 F (36.7 C)  TempSrc:  Oral Oral   SpO2: 98% 97% 97% 97%  Weight:      Height:        General: Pt is alert, awake, not in acute distress Cardiovascular: RRR, S1/S2 +, no rubs, no gallops Respiratory: CTA bilaterally, no wheezing, no rhonchi Abdominal: Soft, NT, ND, bowel sounds + Extremities: no edema, no cyanosis    The results of significant diagnostics from this hospitalization (including imaging, microbiology, ancillary and laboratory) are listed below for reference.     Microbiology: No results found for this or any previous visit (from the past 240 hour(s)).   Labs: BNP (last 3 results) No results for input(s): BNP in the last 8760 hours. Basic Metabolic Panel: No results for input(s): NA, K, CL,  CO2, GLUCOSE, BUN, CREATININE, CALCIUM, MG, PHOS in the last 168 hours. Liver Function Tests: No results for input(s): AST, ALT, ALKPHOS, BILITOT, PROT, ALBUMIN in the last 168 hours. No results for input(s): LIPASE, AMYLASE in the last 168 hours. No results for input(s): AMMONIA in the last 168 hours. CBC: No results for input(s): WBC, NEUTROABS, HGB, HCT, MCV, PLT in the last 168 hours. Cardiac Enzymes: No results for input(s): CKTOTAL, CKMB, CKMBINDEX, TROPONINI in the last 168 hours. BNP: Invalid input(s): POCBNP CBG: Recent Labs  Lab 12/12/19 0803  GLUCAP 158*   D-Dimer No results for input(s): DDIMER in the last 72 hours. Hgb A1c No results for input(s): HGBA1C in the last 72 hours. Lipid Profile No results for input(s): CHOL, HDL, LDLCALC, TRIG, CHOLHDL, LDLDIRECT in the last 72 hours. Thyroid function studies No results for input(s): TSH, T4TOTAL, T3FREE, THYROIDAB in the last 72 hours.  Invalid input(s): FREET3 Anemia work up No results for input(s): VITAMINB12, FOLATE, FERRITIN, TIBC, IRON, RETICCTPCT in the last 72 hours. Urinalysis    Component Value Date/Time   COLORURINE YELLOW (A) 11/25/2019 2206   APPEARANCEUR CLOUDY (A) 11/25/2019 2206   LABSPEC >1.046 (H) 11/25/2019 2206   PHURINE 5.0 11/25/2019 2206   GLUCOSEU NEGATIVE 11/25/2019 2206   HGBUR SMALL (A) 11/25/2019 2206   BILIRUBINUR NEGATIVE 11/25/2019 2206   KETONESUR 5 (A) 11/25/2019 2206   PROTEINUR NEGATIVE 11/25/2019 2206   NITRITE POSITIVE (A) 11/25/2019 2206   LEUKOCYTESUR LARGE (A) 11/25/2019 2206   Sepsis Labs Invalid input(s): PROCALCITONIN,  WBC,  LACTICIDVEN Microbiology No results found for this or any previous visit (from the past 240 hour(s)).   Time coordinating discharge: Over 30 minutes  SIGNED:   Ezekiel Slocumb, DO [facility] 12/16/2019, 1:58 PM   If 7PM-7AM,  please contact night-coverage [email or web address] Password [email or web address]

## 2019-12-16 NOTE — Plan of Care (Signed)
  Problem: Clinical Measurements: Goal: Will remain free from infection Outcome: Progressing Goal: Cardiovascular complication will be avoided Outcome: Progressing   Problem: Pain Managment: Goal: General experience of comfort will improve Outcome: Progressing   

## 2019-12-16 NOTE — ED Notes (Signed)
Pt st repeatedly "I need a cab and go home".

## 2019-12-16 NOTE — ED Notes (Addendum)
Pt denies pain at this time. Pt st "I just want to go home now". NAD noted upon assessment.

## 2019-12-16 NOTE — ED Notes (Signed)
ED Provider Issacs at bedside. 
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
## 2019-12-17 LAB — COMPREHENSIVE METABOLIC PANEL
ALT: 37 U/L (ref 0–44)
AST: 21 U/L (ref 15–41)
Albumin: 3.2 g/dL — ABNORMAL LOW (ref 3.5–5.0)
Alkaline Phosphatase: 59 U/L (ref 38–126)
Anion gap: 12 (ref 5–15)
BUN: 32 mg/dL — ABNORMAL HIGH (ref 8–23)
CO2: 25 mmol/L (ref 22–32)
Calcium: 9 mg/dL (ref 8.9–10.3)
Chloride: 96 mmol/L — ABNORMAL LOW (ref 98–111)
Creatinine, Ser: 0.9 mg/dL (ref 0.61–1.24)
GFR calc Af Amer: >60 mL/min (ref >60)
GFR calc non Af Amer: >60 mL/min (ref >60)
Glucose, Bld: 189 mg/dL — ABNORMAL HIGH (ref 70–99)
Potassium: 4.2 mmol/L (ref 3.5–5.1)
Sodium: 133 mmol/L — ABNORMAL LOW (ref 135–145)
Total Bilirubin: 0.9 mg/dL (ref 0.3–1.2)
Total Protein: 6 g/dL — ABNORMAL LOW (ref 6.5–8.1)

## 2019-12-17 LAB — CBC WITH DIFFERENTIAL/PLATELET
Abs Immature Granulocytes: 0.03 10*3/uL (ref 0.00–0.07)
Basophils Absolute: 0 10*3/uL (ref 0.0–0.1)
Basophils Relative: 0 %
Eosinophils Absolute: 0 10*3/uL (ref 0.0–0.5)
Eosinophils Relative: 0 %
HCT: 36.9 % — ABNORMAL LOW (ref 39.0–52.0)
Hemoglobin: 13.1 g/dL (ref 13.0–17.0)
Immature Granulocytes: 0 %
Lymphocytes Relative: 14 %
Lymphs Abs: 1.1 10*3/uL (ref 0.7–4.0)
MCH: 31.3 pg (ref 26.0–34.0)
MCHC: 35.5 g/dL (ref 30.0–36.0)
MCV: 88.3 fL (ref 80.0–100.0)
Monocytes Absolute: 0.5 10*3/uL (ref 0.1–1.0)
Monocytes Relative: 6 %
Neutro Abs: 6.1 10*3/uL (ref 1.7–7.7)
Neutrophils Relative %: 80 %
Platelets: 113 10*3/uL — ABNORMAL LOW (ref 150–400)
RBC: 4.18 MIL/uL — ABNORMAL LOW (ref 4.22–5.81)
RDW: 13.7 % (ref 11.5–15.5)
WBC: 7.7 10*3/uL (ref 4.0–10.5)
nRBC: 0 % (ref 0.0–0.2)

## 2019-12-17 LAB — BLOOD GAS, VENOUS
Acid-Base Excess: 5 mmol/L — ABNORMAL HIGH (ref 0.0–2.0)
Bicarbonate: 31.8 mmol/L — ABNORMAL HIGH (ref 20.0–28.0)
O2 Saturation: 35.2 %
Patient temperature: 37
pCO2, Ven: 55 mmHg (ref 44.0–60.0)
pH, Ven: 7.37 (ref 7.250–7.430)
pO2, Ven: <31 mmHg — CL (ref 32.0–45.0)

## 2019-12-17 MED ORDER — MORPHINE SULFATE (CONCENTRATE) 10 MG/0.5ML PO SOLN: 10.0000 mg | ORAL | Status: DC | PRN

## 2019-12-17 MED ORDER — CEPHALEXIN 500 MG PO CAPS: 500.0000 mg | ORAL_CAPSULE | Freq: Two times a day (BID) | ORAL | 0 refills | Status: AC

## 2019-12-17 MED ORDER — ACETAMINOPHEN 500 MG PO TABS
500.0000 mg | ORAL_TABLET | Freq: Four times a day (QID) | ORAL | Status: DC | PRN
  Administered 2020-01-02 – 2020-01-13 (×3): 500 mg via ORAL
  Filled 2019-12-17 (×3): qty 1

## 2019-12-17 MED ORDER — SODIUM CHLORIDE 0.9 % IV SOLN
1.0000 g | INTRAVENOUS | Status: AC
  Administered 2019-12-17: 1 g via INTRAVENOUS
  Filled 2019-12-17: qty 10

## 2019-12-17 MED ORDER — POLYVINYL ALCOHOL 1.4 % OP SOLN
1.0000 [drp] | Freq: Four times a day (QID) | OPHTHALMIC | Status: DC | PRN
  Filled 2019-12-17: qty 15

## 2019-12-17 MED ORDER — ALBUTEROL SULFATE HFA 108 (90 BASE) MCG/ACT IN AERS
2.0000 | INHALATION_SPRAY | Freq: Four times a day (QID) | RESPIRATORY_TRACT | Status: DC | PRN
  Filled 2019-12-17: qty 6.7

## 2019-12-17 MED ORDER — CEPHALEXIN 500 MG PO CAPS
500.0000 mg | ORAL_CAPSULE | Freq: Three times a day (TID) | ORAL | Status: DC
  Administered 2019-12-17 – 2020-01-01 (×40): 500 mg via ORAL
  Filled 2019-12-17 (×45): qty 1

## 2019-12-17 MED ORDER — QUETIAPINE FUMARATE 25 MG PO TABS
25.0000 mg | ORAL_TABLET | Freq: Three times a day (TID) | ORAL | Status: DC
  Administered 2019-12-17 – 2020-01-15 (×76): 25 mg via ORAL
  Filled 2019-12-17 (×76): qty 1

## 2019-12-17 MED ORDER — DEXAMETHASONE 4 MG PO TABS
2.0000 mg | ORAL_TABLET | Freq: Two times a day (BID) | ORAL | Status: DC
  Administered 2019-12-17 – 2020-01-15 (×56): 2 mg via ORAL
  Filled 2019-12-17 (×65): qty 0.5

## 2019-12-17 MED ORDER — HALOPERIDOL LACTATE 5 MG/ML IJ SOLN
INTRAMUSCULAR | Status: AC
  Filled 2019-12-17: qty 1

## 2019-12-17 MED ORDER — HALOPERIDOL LACTATE 5 MG/ML IJ SOLN
5.0000 mg | Freq: Once | INTRAMUSCULAR | Status: AC
  Administered 2019-12-17: 16:00:00 5 mg via INTRAMUSCULAR

## 2019-12-17 MED ORDER — BIOTENE DRY MOUTH MT LIQD
15.0000 mL | OROMUCOSAL | Status: DC | PRN
  Filled 2019-12-17: qty 15

## 2019-12-17 NOTE — ED Notes (Signed)
BEHAVIORAL HEALTH ROUNDING Patient sleeping: Yes.   Patient alert and oriented: eyes closed  Appears asleep Behavior appropriate: Yes.  ; If no, describe:  Nutrition and fluids offered: Yes  Toileting and hygiene offered: sleeping Sitter present: q 15 minute observations 

## 2019-12-17 NOTE — Consult Note (Signed)
  85 year old male patient with Alzheimer's dementia,  edema of the brain, Covid positive, and also possible UTI who was sent back from SNF facility due to agitation.  Patient had a calm night and was observed to be resting calmly this morning.  Psychiatry was consulted for medication recommendations.  From chart review can see patient was somewhat agitated and would likely benefit from use of medications to help control his agitation, confusion.   Patient will benefit from Seroquel 25 mg 3 times daily either standing or as needed for agitation.

## 2019-12-17 NOTE — ED Notes (Signed)
IVC/Consult completed/Covid +/ CM placement

## 2019-12-17 NOTE — ED Notes (Signed)
BEHAVIORAL HEALTH ROUNDING Patient sleeping: No. Patient alert : yes Behavior appropriate: Yes.  ; If no, describe:  Nutrition and fluids offered: yes Toileting and hygiene offered: Yes  Sitter present: q15 minute observations    

## 2019-12-17 NOTE — ED Provider Notes (Signed)
-----------------------------------------   5:24 AM on 12/17/2019 -----------------------------------------   Blood pressure (!) 146/62, pulse (!) 50, temperature 97.8 F (36.6 C), temperature source Oral, resp. rate 20, height 1.829 m (6'), weight 79.1 kg, SpO2 99 %.  The patient is calm and cooperative at this time.  There have been no acute events since the last update.  Unfortunately the patient was unable to voluntarily produce a urine specimen overnight, and he is combative and aggressive enough when anyone approaches him that I feel that the risk of cathing the patient, both of the patient and the nursing staff, outweighs the benefits.  We have the results of the urinalysis from 3 weeks ago when he was admitted to the hospital that clearly shows evidence of urinary tract infection, but unfortunately no urine culture was obtained and no indication exists in the medical record that the UTI was treated while he was inpatient.  I will treat empirically with ceftriaxone 1 g IV and I have written a prescription to be sent to his pharmacy for Keflex 500 mg p.o. twice daily x1 week.  Again, I would prefer if we can get a new specimen to be tested, but I feel that sedating the patient sufficiently to allow for a safe catheterization is uncalled for an unnecessary and more risky than simply treating empirically.  Psychiatry did not assess the patient overnight due to lack of appropriate PPE.  He is awaiting psychiatric evaluation as well as assistance with care management to see if he can go back to his facility at which he was just placed.  The psychiatry consult was for assistance with medication recommendations and might make his behavior more acceptable and manageable.   Hinda Kehr, MD 12/17/19 [phone]

## 2019-12-17 NOTE — ED Notes (Signed)
Sent message to pharmacy regarding missing decadron.

## 2019-12-17 NOTE — ED Notes (Addendum)
BEHAVIORAL HEALTH ROUNDING Patient sleeping: No. Patient alert : yes Behavior appropriate: Yes.  ; If no, describe:  Nutrition and fluids offered: yes Toileting and hygiene offered: Yes  Sitter present: q15 minute observations  Law enforcement present: Yes  [employer]  ENVIRONMENTAL ASSESSMENT Potentially harmful objects out of patient reach: Yes.   Personal belongings secured: Yes.   Patient dressed in hospital provided attire only: Yes.   Plastic bags out of patient reach: Yes.   Patient care equipment (cords, cables, call bells, lines, and drains) shortened, removed, or accounted for: Yes.   Equipment and supplies removed from bottom of stretcher: Yes.   Potentially toxic materials out of patient reach: Yes.   Sharps container removed or out of patient reach: Yes.

## 2019-12-17 NOTE — ED Notes (Addendum)
BEHAVIORAL HEALTH ROUNDING Patient sleeping: No. Patient alert : yes Behavior appropriate: Yes.  ; If no, describe:  Nutrition and fluids offered: yes Toileting and hygiene offered: Yes  Sitter present: q15 minute observations    

## 2019-12-17 NOTE — ED Notes (Signed)
Sent message to pharmacy requesting medication verification

## 2019-12-17 NOTE — ED Notes (Signed)
Informed MD of pt continuing to not provide urine sample. See new orders and MD note.

## 2019-12-17 NOTE — ED Notes (Signed)
Showed Dr. Ellender Hose VBG results.

## 2019-12-17 NOTE — ED Notes (Signed)
PT cleaned of incontinent urine, new brief and gown provided.

## 2019-12-17 NOTE — ED Notes (Signed)
He is alert and oriented to himself at time of assessment   Cooperative and polite  Covid results were three weeks ago  Psychiatry to see  He denies SI/HI

## 2019-12-17 NOTE — ED Notes (Signed)
Pt transferred to room 21  He is IVC   Patient assigned to appropriate care area.  Attempted to orient pt to unit/care area:  He is COVID +  Room set up with brown paper bag for trash  signage placed on the door

## 2019-12-17 NOTE — ED Notes (Signed)
Pt changed from hospital gown into burgundy scrubs. Pt calm and cooperative. Belongings already in bag, bag transported to [facility] by tracy.
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
## 2019-12-17 NOTE — ED Notes (Signed)
Attempted to get urine. PT became aggressive. PT agreed to hit call bell when he has to urinate. Urinal left at bedside. Call bell in place.

## 2019-12-17 NOTE — ED Notes (Signed)
Pt resting quietly in room, pt took night time meds without difficulty. Pt had to be reminded to put mask on. Pt has to frequently be reminded to stay in room and that a urinal and BSC are provided for him to use to void. Pt has been steady on his feet when ambulating. Room darkened, TV off, pt has blanket.

## 2019-12-17 NOTE — Care Management (Addendum)
ED RN CM: Talked with [facility] and patient can not return to this SNF. Patient was combative with staff and refused to be admitted to SNF and they do not feel as though they can handle him at this time.   RN CM: Left message for brother requesting callback to discuss discharge plans. Will wait for psych evaluation to determine safe dc plan.

## 2019-12-17 NOTE — ED Notes (Signed)
Called pharmacy to verify decadron. Lonn Georgia, pharmacy tech, states she will have pharmacist verify it. PT remains sleeping at this time.

## 2019-12-17 NOTE — ED Notes (Signed)
Pt states does not have to use restroom and that he is not dirty

## 2019-12-18 LAB — URINALYSIS, COMPLETE (UACMP) WITH MICROSCOPIC
Bacteria, UA: NONE SEEN
Bilirubin Urine: NEGATIVE
Glucose, UA: 150 mg/dL — AB
Hgb urine dipstick: NEGATIVE
Ketones, ur: NEGATIVE mg/dL
Nitrite: NEGATIVE
Protein, ur: NEGATIVE mg/dL
Specific Gravity, Urine: 1.014 (ref 1.005–1.030)
Squamous Epithelial / HPF: NONE SEEN (ref 0–5)
pH: 6 (ref 5.0–8.0)

## 2019-12-18 NOTE — ED Notes (Signed)
Pt up independently to restroom 

## 2019-12-18 NOTE — ED Notes (Signed)
Pt easily aroused for vitals. Calm and cooperative at this time. Requests lights be turned off to continue resting.

## 2019-12-18 NOTE — ED Notes (Signed)
Pt given meal tray.
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
## 2019-12-18 NOTE — ED Notes (Signed)
Pt ambulated to bathroom 

## 2019-12-18 NOTE — ED Notes (Signed)
Given  breakfast

## 2019-12-18 NOTE — ED Notes (Signed)
Attempted to call brother with no answer at this time. Left hippa compliant message on voicemail.

## 2019-12-18 NOTE — ED Provider Notes (Signed)
-----------------------------------------   4:31 AM on 12/18/2019 -----------------------------------------   Blood pressure 110/67, pulse 66, temperature 97.6 F (36.4 C), temperature source Oral, resp. rate 18, height 6' (1.829 m), weight 79.1 kg, SpO2 96 %.  The patient is calm and cooperative at this time.  There have been no acute events since the last update.  Awaiting disposition plan from Behavioral Medicine and/or Social Work team(s).   Arta Silence, MD 12/18/19 [phone]

## 2019-12-18 NOTE — Care Management (Signed)
RN CM: Incoming call from [facility] states he may be able to accept patient but wanted to talk with patients brother first regarding the financial aspect. Confirmed brothers number and will await acceptance from SNF.

## 2019-12-18 NOTE — ED Notes (Signed)
Patient got up about 15 min ago and went to bathroom

## 2019-12-18 NOTE — ED Notes (Signed)
Brother still not answering phone.

## 2019-12-18 NOTE — ED Notes (Signed)
Pt up and requesting light be turned on so he can eat. Food tray set up and pt eating.

## 2019-12-18 NOTE — Plan of Care (Signed)
PMT note: Previous D/C plans to discharge to long term care with hospice. He was sent back here because of behavioral concerns and has been cooperative at this facility. Spoke with EDP, per conversation no consult needed. Please re- consult if needed.

## 2019-12-19 NOTE — ED Provider Notes (Signed)
-----------------------------------------   6:16 AM on 12/19/2019 -----------------------------------------   Blood pressure (!) 126/58, pulse 82, temperature 98 F (36.7 C), temperature source Oral, resp. rate 18, height 6' (1.829 m), weight 79.1 kg, SpO2 98 %.  The patient is calm and cooperative at this time.  There have been no acute events since the last update.  Awaiting disposition plan from Behavioral Medicine and/or Social Work team(s).   Paulette Blanch, MD 12/19/19 [phone]

## 2019-12-19 NOTE — ED Notes (Signed)
BEHAVIORAL HEALTH ROUNDING Patient sleeping: No. Patient alert and oriented: yes Behavior appropriate: Yes.  ; If no, describe:  Nutrition and fluids offered: yes Toileting and hygiene offered: Yes  Sitter present: q15 minute observations  Law enforcement present: Yes [employer]  

## 2019-12-19 NOTE — ED Notes (Signed)
BEHAVIORAL HEALTH ROUNDING Patient sleeping: Yes.   Patient alert and oriented: eyes closed  Appears asleep Behavior appropriate: Yes.  ; If no, describe:  Nutrition and fluids offered: Yes  Toileting and hygiene offered: sleeping Sitter present: q 15 minute observations  Law enforcement present: yes  [employer]

## 2019-12-19 NOTE — ED Notes (Signed)
BEHAVIORAL HEALTH ROUNDING Patient sleeping: No. Patient alert and oriented: yes Behavior appropriate: Yes.  ; If no, describe:  Nutrition and fluids offered: yes Toileting and hygiene offered: Yes  Sitter present: q15 minute observations  

## 2019-12-19 NOTE — ED Notes (Signed)
Pt. Awake requested and was given crackers, pb and drink.

## 2019-12-19 NOTE — ED Notes (Signed)
Pt. Requested light to be off, light turned off.

## 2019-12-19 NOTE — ED Notes (Signed)
IVC/ Covid +/ CM Placement

## 2019-12-19 NOTE — ED Notes (Signed)
ED  Is the patient under IVC or is there intent for IVC: Yes.  IVC for his safety  Is the patient medically cleared: Yes.   Is there vacancy in the ED BHU: Yes.   Is the population mix appropriate for patient: Yes.   Is the patient awaiting placement in inpatient or outpatient setting: Yes social work consult in progress  - awaiting SNF placement  Has the patient had a psychiatric consult: Yes.  Cleared by psychiatry  Survey of unit performed for contraband, proper placement and condition of furniture, tampering with fixtures in bathroom, shower, and each patient room: Yes.  ; Findings:  APPEARANCE/BEHAVIOR Calm and cooperative NEURO ASSESSMENT Orientation: oriented x self, place   Denies pain Hallucinations: No.None noted (Hallucinations) denies  Speech: Normal Gait: normal RESPIRATORY ASSESSMENT Even  Unlabored respirations  CARDIOVASCULAR ASSESSMENT Pulses equal   regular rate  Skin warm and dry   GASTROINTESTINAL ASSESSMENT no GI complaint EXTREMITIES Full ROM  PLAN OF CARE Provide calm/safe environment. Vital signs assessed twice daily. ED BHU Assessment once each 12-hour shift. Collaborate with TTS daily or as condition indicates. Assure the ED provider has rounded once each shift. Provide and encourage hygiene. Provide redirection as needed. Assess for escalating behavior; address immediately and inform ED provider.  Assess family dynamic and appropriateness for visitation as needed: Yes.  ; If necessary, describe findings:  Educate the patient/family about BHU procedures/visitation: Yes.  ; If necessary, describe findings:

## 2019-12-19 NOTE — ED Notes (Signed)
Pt. Requested and was given remote for tv and lights turned off per pt. Request.

## 2019-12-19 NOTE — ED Notes (Signed)
Patient woke up and went to bathroom.

## 2019-12-19 NOTE — ED Notes (Signed)
BEHAVIORAL HEALTH ROUNDING Patient sleeping: Yes.   Patient alert and oriented: eyes closed  Appears asleep Behavior appropriate: Yes.  ; If no, describe:  Nutrition and fluids offered: Yes  Toileting and hygiene offered: sleeping Sitter present: q 15 minute observations  Law enforcement present: yes  [employer]  ENVIRONMENTAL ASSESSMENT Potentially harmful objects out of patient reach: Yes.   Personal belongings secured: Yes.   Patient dressed in hospital provided attire only: Yes.   Plastic bags out of patient reach: Yes.   Patient care equipment (cords, cables, call bells, lines, and drains) shortened, removed, or accounted for: Yes.   Equipment and supplies removed from bottom of stretcher: Yes.   Potentially toxic materials out of patient reach: Yes.   Sharps container removed or out of patient reach: Yes.

## 2019-12-19 NOTE — ED Notes (Signed)
Patient observed lying in bed with eyes closed  Even, unlabored respirations observed   NAD pt appears to be sleeping  I will continue to monitor along with every 15 minute visual observations     

## 2019-12-20 LAB — URINE CULTURE
Culture: <10000 — AB
Special Requests: NORMAL

## 2019-12-20 NOTE — ED Notes (Signed)
Pt given meal tray.

## 2019-12-20 NOTE — ED Notes (Signed)
Pt given dinner tray.

## 2019-12-20 NOTE — ED Notes (Signed)
Pt. Got up to use bathroom, pt. Returned to room #21 with steady gait.

## 2019-12-20 NOTE — ED Notes (Signed)
Pt. Currently sleeping in bed 21 A.

## 2019-12-21 NOTE — ED Notes (Signed)
Pt given bath with warm wipes. Pt able to wipe and change self. Pt used to taking a bath and did not feel safe using shower. Pt given new clothes and new sheets. Pt also given toothbrush and toothpaste.

## 2019-12-21 NOTE — ED Notes (Signed)
Pt. Up using bathroom, pt. Returned to room with steady gait. 

## 2019-12-21 NOTE — ED Notes (Signed)
Pt given meal tray. Lights turned on so pt can eat.

## 2019-12-21 NOTE — ED Notes (Signed)
Pt given meal tray.

## 2019-12-21 NOTE — ED Provider Notes (Signed)
-----------------------------------------   2:10 AM on 12/21/2019 -----------------------------------------   Blood pressure 140/61, pulse 78, temperature 97.6 F (36.4 C), temperature source Oral, resp. rate 16, height 6' (1.829 m), weight 79.1 kg, SpO2 97 %.  The patient is calm and cooperative at this time.  There have been no acute events since the last update.  Awaiting disposition plan from Behavioral Medicine and/or Social Work team(s).    Merlyn Lot, MD 12/21/19 [phone]

## 2019-12-21 NOTE — ED Notes (Signed)
Pt. Up using bathroom. 

## 2019-12-22 NOTE — ED Notes (Signed)
Pt given meal tray but asleep at this time

## 2019-12-22 NOTE — ED Notes (Signed)
Pt given meal tray.

## 2019-12-22 NOTE — ED Notes (Signed)
Pt refuses shower.

## 2019-12-22 NOTE — ED Notes (Signed)
Pt easily arousable for medication. Goes back to sleep after med.

## 2019-12-22 NOTE — ED Provider Notes (Signed)
-----------------------------------------   7:18 AM on 12/22/2019 -----------------------------------------   Blood pressure (!) 148/58, pulse 61, temperature (!) 97.5 F (36.4 C), temperature source Oral, resp. rate 18, height 6' (1.829 m), weight 79.1 kg, SpO2 98 %.  The patient is calm and cooperative at this time.  There have been no acute events since the last update.  Awaiting disposition plan from Behavioral Medicine and/or Social Work team(s).    Duffy Bruce, MD 12/22/19 [phone]

## 2019-12-23 NOTE — ED Notes (Signed)
Change of clothing provided

## 2019-12-23 NOTE — ED Notes (Signed)
ED  Is the patient under IVC or is there intent for IVC: Yes.   Is the patient medically cleared: Yes.   Is there vacancy in the ED BHU: Yes.   Is the population mix appropriate for patient:  Is the patient awaiting placement in inpatient or outpatient setting:  Social work is looking for him a safe place to live - SNF Has the patient had a psychiatric consult: Yes.   Survey of unit performed for contraband, proper placement and condition of furniture, tampering with fixtures in bathroom, shower, and each patient room: Yes.  ; Findings:  APPEARANCE/BEHAVIOR Calm and cooperative NEURO ASSESSMENT Orientation: oriented x3  Denies pain Hallucinations: No.None noted (Hallucinations) denies at this time  Speech: Normal Gait: normal RESPIRATORY ASSESSMENT Even  Unlabored respirations  CARDIOVASCULAR ASSESSMENT Pulses equal   regular rate  Skin warm and dry   GASTROINTESTINAL ASSESSMENT no GI complaint EXTREMITIES Full ROM  PLAN OF CARE Provide calm/safe environment. Vital signs assessed twice daily. ED BHU Assessment once each 12-hour shift.  Assure the ED provider has rounded once each shift. Provide and encourage hygiene. Provide redirection as needed. Assess for escalating behavior; address immediately and inform ED provider.  Assess family dynamic and appropriateness for visitation as needed: Yes.  ; If necessary, describe findings:  Educate the patient/family about BHU procedures/visitation: Yes.  ; If necessary, describe findings:

## 2019-12-23 NOTE — ED Notes (Signed)
Hourly rounding reveals patient in room. No complaints, stable, in no acute distress. Q15 minute rounds and monitoring via Rover and Officer to continue.   

## 2019-12-23 NOTE — ED Notes (Signed)
Breakfast tray given. °

## 2019-12-23 NOTE — ED Notes (Signed)
Meal tray and cup of water given

## 2019-12-23 NOTE — ED Notes (Signed)
BEHAVIORAL HEALTH ROUNDING Patient sleeping: Yes.   Patient alert and oriented: eyes closed  Appears asleep Behavior appropriate: Yes.  ; If no, describe:  Nutrition and fluids offered: Yes  Toileting and hygiene offered: sleeping Sitter present: q 15 minute observations  Law enforcement present: yes  [employer] 

## 2019-12-23 NOTE — ED Notes (Signed)
BEHAVIORAL HEALTH ROUNDING Patient sleeping: Yes.   Patient alert and oriented: eyes closed  Appears asleep Behavior appropriate: Yes.  ; If no, describe:  Nutrition and fluids offered: Yes  Toileting and hygiene offered: sleeping Sitter present: q 15 minute observations Law enforcement present: yes  [employer]  ENVIRONMENTAL ASSESSMENT Potentially harmful objects out of patient reach: Yes.   Personal belongings secured: Yes.   Patient dressed in hospital provided attire only: Yes.   Plastic bags out of patient reach: Yes.   Patient care equipment (cords, cables, call bells, lines, and drains) shortened, removed, or accounted for: Yes.   Equipment and supplies removed from bottom of stretcher: Yes.   Potentially toxic materials out of patient reach: Yes.   Sharps container removed or out of patient reach: Yes.

## 2019-12-23 NOTE — ED Notes (Signed)
2ND  SET OF  IVC PAPERS  DONE  PENDING  PLACEMENT

## 2019-12-23 NOTE — ED Notes (Signed)
Report to include Situation, Background, Assessment, and Recommendations received from Amy RN. Patient alert and oriented, warm and dry, in no acute distress. Patient denies SI, HI, AVH and pain. Patient made aware of Q15 minute rounds and Rover and Officer presence for their safety. Patient instructed to come to me with needs or concerns.   

## 2019-12-23 NOTE — ED Notes (Signed)
Patient observed lying in bed with eyes closed  Even, unlabored respirations observed   NAD pt appears to be sleeping  I will continue to monitor along with every 15 minute visual observations     

## 2019-12-23 NOTE — Consult Note (Signed)
[facility] Psych ED Progress Note  12/23/2019 2:45 PM Bryan Buck  MRN:  [id number]  Principal Problem: <principal problem not specified> Diagnosis:   AMS Hx of Dementia R/p dementia with behavioral disturbance.   Total Time spent with patient: 25 minutes   HPI: She reports having a good day today.  He subjectively denies irritability depression and anxiety.  Says that he slept well.  I am told the social worker still attempting to find placement for this patient.  Denies hallucinations.  According to the EMR, patient has not needed any as needed psychiatric medications for agitation/anxiety.  Past Medical History:  Past Medical History:  Diagnosis Date  . Cancer ([place])    colon  . Diabetes mellitus without complication ([place])   . Generalized headaches 06/15/2015   pt reported    Past Surgical History:  Procedure Laterality Date  . EYE SURGERY     lens implant right eye  . HERNIA REPAIR    . IRRIGATION AND DEBRIDEMENT KNEE Right 01/16/2017   Procedure: IRRIGATION AND DEBRIDEMENT KNEE;  Surgeon: Corky Mull, MD;  Location: [facility] ORS;  Service: Orthopedics;  Laterality: Right;  . KNEE ARTHROSCOPY Right 01/16/2017   Procedure: ARTHROSCOPY KNEE;  Surgeon: Corky Mull, MD;  Location: [facility] ORS;  Service: Orthopedics;  Laterality: Right;   Family History: History reviewed. No pertinent family history. Social History:  Social History   Substance and Sexual Activity  Alcohol Use Yes  . Alcohol/week: 6.0 standard drinks  . Types: 6 Cans of beer per week     Social History   Substance and Sexual Activity  Drug Use No    Social History   Socioeconomic History  . Marital status: Divorced    Spouse name: Not on file  . Number of children: Not on file  . Years of education: Not on file  . Highest education level: Not on file  Occupational History  . Not on file  Tobacco Use  . Smoking status: Former Smoker    Packs/day: 2.00    Years: 20.00    Pack years: 40.00    Types:  Cigarettes  . Smokeless tobacco: Former Systems developer    Quit date: 06/14/1993  Substance and Sexual Activity  . Alcohol use: Yes    Alcohol/week: 6.0 standard drinks    Types: 6 Cans of beer per week  . Drug use: No  . Sexual activity: Not on file  Other Topics Concern  . Not on file  Social History Narrative  . Not on file   Social Determinants of Health   Financial Resource Strain:   . Difficulty of Paying Living Expenses: Not on file  Food Insecurity:   . Worried About Charity fundraiser in the Last Year: Not on file  . Ran Out of Food in the Last Year: Not on file  Transportation Needs:   . Lack of Transportation (Medical): Not on file  . Lack of Transportation (Non-Medical): Not on file  Physical Activity:   . Days of Exercise per Week: Not on file  . Minutes of Exercise per Session: Not on file  Stress:   . Feeling of Stress : Not on file  Social Connections:   . Frequency of Communication with Friends and Family: Not on file  . Frequency of Social Gatherings with Friends and Family: Not on file  . Attends Religious Services: Not on file  . Active Member of Clubs or Organizations: Not on file  . Attends Archivist Meetings:  Not on file  . Marital Status: Not on file    Current Medications: Current Facility-Administered Medications  Medication Dose Route Frequency Provider Last Rate Last Admin  . acetaminophen (TYLENOL) tablet 500 mg  500 mg Oral Q6H PRN Duffy Bruce, MD      . albuterol (VENTOLIN HFA) 108 (90 Base) MCG/ACT inhaler 2 puff  2 puff Inhalation Q6H PRN Duffy Bruce, MD      . antiseptic oral rinse (BIOTENE) solution 15 mL  15 mL Topical PRN Duffy Bruce, MD      . cephALEXin (KEFLEX) capsule 500 mg  500 mg Oral Q8H Duffy Bruce, MD   500 mg at 12/22/19 2238  . dexamethasone (DECADRON) tablet 2 mg  2 mg Oral BID Duffy Bruce, MD   2 mg at 12/22/19 2238  . polyvinyl alcohol (LIQUIFILM TEARS) 1.4 % ophthalmic solution 1 drop  1 drop Both  Eyes QID PRN Duffy Bruce, MD      . QUEtiapine (SEROQUEL) tablet 25 mg  25 mg Oral TID Delman Kitten, MD   25 mg at 12/22/19 2238   Current Outpatient Medications  Medication Sig Dispense Refill  . albuterol (PROAIR HFA) 108 (90 Base) MCG/ACT inhaler Inhale 2 puffs into the lungs every 6 (six) hours as needed for wheezing or shortness of breath.    Marland Kitchen antiseptic oral rinse (BIOTENE) LIQD Apply 15 mLs topically as needed for dry mouth.    . Morphine Sulfate (MORPHINE CONCENTRATE) 10 MG/0.5ML SOLN concentrated solution Take 0.5 mLs (10 mg total) by mouth every 2 (two) hours as needed for moderate pain, severe pain, anxiety or shortness of breath. 180 mL 0  . acetaminophen (TYLENOL) 500 MG tablet Take 500 mg by mouth every 6 (six) hours as needed for mild pain or fever.     . cephALEXin (KEFLEX) 500 MG capsule Take 1 capsule (500 mg total) by mouth 2 (two) times daily. 14 capsule 0  . dexamethasone (DECADRON) 2 MG tablet Take 1 tablet (2 mg total) by mouth 2 (two) times daily. 60 tablet 0  . polyvinyl alcohol (LIQUIFILM TEARS) 1.4 % ophthalmic solution Place 1 drop into both eyes 4 (four) times daily as needed for dry eyes. 15 mL 0    Lab Results: No results found for this or any previous visit (from the past 48 hour(s)).  Blood Alcohol level:  Lab Results  Component Value Date   ETH <10 11/25/2019    Musculoskeletal: Strength & Muscle Tone: within normal limits Gait & Station: normal Patient leans: Backward  Psychiatric Specialty Exam: Physical Exam  Review of Systems  Blood pressure 140/66, pulse 94, temperature 97.9 F (36.6 C), temperature source Oral, resp. rate 17, height 6' (1.829 m), weight 79.1 kg, SpO2 98 %.Body mass index is 23.65 kg/m.  General Appearance: Fairly Groomed  Eye Contact:  Fair  Speech:  Clear and Coherent  Volume:  Decreased  Mood:  Negative  Affect:  Appropriate  Thought Process:  Coherent  Orientation:  Full (Time, Place, and Person)  Thought  Content:  Logical  Suicidal Thoughts:  No  Homicidal Thoughts:  No  Memory:  Immediate;   Poor  Judgement:  Impaired  Insight:  Shallow  Psychomotor Activity:  Normal  Concentration:  Concentration: Poor  Recall:  Poor  Fund of Knowledge:  Poor  Language:  Fair  Akathisia:  No  Handed:  Right  AIMS (if indicated):     Assets:  Resilience  ADL's:  Intact  Cognition:  WNL  Sleep:         Treatment Plan Summary: No medication changes Pending placement We will renew patient's involuntary commitment due to history of aggression in the context of dementia and poor insight  Rulon Sera, MD 12/23/2019, 2:45 PM

## 2019-12-23 NOTE — Care Management (Signed)
RN CM: Call to [facility] following up on potential acceptance. No other bed offers made at this time. Patient with no aggressive episodes per notes. If [facility] unable to accept will reevaluate and resend requests.

## 2019-12-24 NOTE — ED Notes (Signed)
Pt. Alert and oriented, warm and dry, in no distress. Pt. Denies SI, HI, and AVH. Pt. Encouraged to let nursing staff know of any concerns or needs. 

## 2019-12-24 NOTE — ED Notes (Signed)
Hourly rounding reveals patient in room. No complaints, stable, in no acute distress. Q15 minute rounds and monitoring via Rover and Officer to continue.   

## 2019-12-24 NOTE — ED Notes (Signed)
BEHAVIORAL HEALTH ROUNDING Patient sleeping: Yes.   Patient alert and oriented: eyes closed  Appears asleep Behavior appropriate: Yes.  ; If no, describe:  Nutrition and fluids offered: Yes  Toileting and hygiene offered: sleeping Sitter present: q 15 minute observations  Law enforcement present: yes  [employer] 

## 2019-12-24 NOTE — ED Notes (Signed)
BEHAVIORAL HEALTH ROUNDING Patient sleeping: No. Patient alert and oriented: yes Behavior appropriate: Yes.  ; If no, describe:  Nutrition and fluids offered: yes Toileting and hygiene offered: Yes  Sitter present: q15 minute observations  Law enforcement present: Yes [employer]  

## 2019-12-24 NOTE — ED Notes (Signed)
Patient observed lying in bed with eyes closed  Even, unlabored respirations observed   NAD pt appears to be sleeping  I will continue to monitor along with every 15 minute visual observations     

## 2019-12-24 NOTE — ED Provider Notes (Signed)
-----------------------------------------   6:06 AM on 12/24/2019 -----------------------------------------   Blood pressure (!) 118/53, pulse 83, temperature 97.9 F (36.6 C), temperature source Oral, resp. rate 18, height 1.829 m (6'), weight 79.1 kg, SpO2 99 %.  The patient is calm and cooperative at this time.  There have been no acute events since the last update.  Awaiting disposition plan from Behavioral Medicine and/or Social Work team(s).   Hinda Kehr, MD 12/24/19 [phone]

## 2019-12-24 NOTE — ED Notes (Signed)
BEHAVIORAL HEALTH ROUNDING Patient sleeping: Yes.   Patient alert and oriented: eyes closed  Appears asleep Behavior appropriate: Yes.  ; If no, describe:  Nutrition and fluids offered: Yes  Toileting and hygiene offered: sleeping Sitter present: q 15 minute observations  Law enforcement present: yes  [employer]  ENVIRONMENTAL ASSESSMENT Potentially harmful objects out of patient reach: Yes.   Personal belongings secured: Yes.   Patient dressed in hospital provided attire only: Yes.   Plastic bags out of patient reach: Yes.   Patient care equipment (cords, cables, call bells, lines, and drains) shortened, removed, or accounted for: Yes.   Equipment and supplies removed from bottom of stretcher: Yes.   Potentially toxic materials out of patient reach: Yes.   Sharps container removed or out of patient reach: Yes.

## 2019-12-24 NOTE — ED Notes (Signed)
Provided breakfast tray for pt.

## 2019-12-24 NOTE — ED Notes (Signed)
This tech gave pt peanut butter and graham crackers. Pt also had 240 mLs of grape juice.

## 2019-12-24 NOTE — ED Notes (Signed)

## 2019-12-24 NOTE — ED Notes (Signed)
IVC/Pending Placement 

## 2019-12-25 NOTE — ED Notes (Signed)
Brother Bryan Buck called phone given to p[atient to speak with family.

## 2019-12-25 NOTE — ED Notes (Signed)
Assumed care of patient, patient pleasantly confused but cooperative. Patient showered this morning had a change of clothing, linen changed. Patient ate 100% of breakfast provided. Social worker at bedside to discuss plan of care and possible placement.

## 2019-12-25 NOTE — ED Provider Notes (Signed)
-----------------------------------------   8:36 AM on 12/25/2019 -----------------------------------------   Blood pressure 130/66, pulse 60, temperature 97.6 F (36.4 C), temperature source Oral, resp. rate 20, height 6' (1.829 m), weight 79.1 kg, SpO2 100 %.  The patient is calm and cooperative at this time.  There have been no acute events since the last update.  Awaiting disposition plan from Behavioral Medicine and/or Social Work team(s).   Carrie Mew, MD 12/25/19 [phone]

## 2019-12-25 NOTE — ED Notes (Signed)
Dinner tray provided

## 2019-12-25 NOTE — ED Notes (Signed)
Meal tray placed in rm. Pt desires to sleep at this time.

## 2019-12-25 NOTE — ED Notes (Signed)
Pt given dinner tray.

## 2019-12-26 NOTE — ED Notes (Signed)
Lunch tray provided to pt.

## 2019-12-26 NOTE — TOC Progression Note (Signed)
Transition of Care [facility]) - Progression Note    Patient Details  Name: Bryan Buck MRN: [id number] Date of Birth: [birth year]  Transition [facility]) CM/SW Contact  Anselm Pancoast, RN Phone Number: 12/26/2019, 12:19 PM  Clinical Narrative:    CM attempted to talk with patient about potential placement and request from brother to have chaplain visit. Patient was very agitated and advised CM to " get out and mind your own business". CM will attempt to visit with patient at another time.    Expected Discharge Plan: [place]    Expected Discharge Plan and Services Expected Discharge Plan: [place] arrangements for the past 2 months: Single Family Home                                       Social Determinants of Health (SDOH) Interventions    Readmission Risk Interventions No flowsheet data found.

## 2019-12-26 NOTE — ED Notes (Signed)
Hourly rounding reveals patient in room. No complaints, stable, in no acute distress. Q15 minute rounds and monitoring via Rover and Officer to continue.   

## 2019-12-26 NOTE — ED Notes (Signed)
Report to include Situation, Background, Assessment, and Recommendations received from Jeannette RN. Patient alert and oriented, warm and dry, in no acute distress. Patient denies SI, HI, AVH and pain. Patient made aware of Q15 minute rounds and Rover and Officer presence for their safety. Patient instructed to come to me with needs or concerns.   

## 2019-12-26 NOTE — ED Notes (Signed)
Pt given meal tray.

## 2019-12-26 NOTE — ED Provider Notes (Signed)
-----------------------------------------   6:33 AM on 12/26/2019 -----------------------------------------   Blood pressure (!) 143/55, pulse 99, temperature (!) 96.9 F (36.1 C), temperature source Oral, resp. rate 18, height 6' (1.829 m), weight 79.1 kg, SpO2 99 %.  The patient is calm and cooperative at this time.  There have been no acute events since the last update.  Awaiting disposition plan from Behavioral Medicine and/or Social Work team(s).   Paulette Blanch, MD 12/26/19 [phone]

## 2019-12-26 NOTE — ED Notes (Signed)
Resumed care from Hewan, RN. Pt resting comfortably.  

## 2019-12-26 NOTE — TOC Progression Note (Signed)
Transition of Care [facility]) - Progression Note    Patient Details  Name: Bryan Buck MRN: [id number] Date of Birth: [birth year]  [facility]) CM/SW [place], RN Phone Number: 12/26/2019, 5:05 PM  Clinical Narrative:    [facility] Social worker, Gwenlyn Found here for evaluation. States patient scored 4/30 on mini mental assessment and definitely not safe for discharge home alone. More appropriate for memory care unit. RN CM will talk with MD about competency due to patient resistant to discharge to facility. APS SW will be Milus Glazier @ [phone] starting Monday. CM will begin searching for memory care unit and discussing with brother potential for memory care unit and need to discuss financial burden on patient in more detail.    Expected Discharge Plan: [place]    Expected Discharge Plan and Services Expected Discharge Plan: [place] arrangements for the past 2 months: Single Family Home                                       Social Determinants of Health (SDOH) Interventions    Readmission Risk Interventions No flowsheet data found.

## 2019-12-26 NOTE — Care Management (Addendum)
RN CM: Call made to APS, Hassan Rowan,  for potential self neglect. Patient is requesting discharge home and refusing SNF placement. Has brother who lives 1 hour away and reports patient does not allow anyone to home or to provide assistance. 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 Patient states he has neighbors who will assist him if he calls them but he has not needed to call them.   RN CM: Call to brother, Hilberto Bulkley, to update of APS referral. Verbalized understanding and states he is not able to assist in care due to his own health concerns.   RN CM: Outreach to Santiago Glad @ [facility] for possible evaluation of Hospice services. Patient not appropriate for inpatient Hospice. No caregivers at home to assist with home hospice.

## 2019-12-26 NOTE — ED Notes (Signed)
Report to include Situation, Background, Assessment, and Recommendations received from Mary RN. Patient alert and oriented, warm and dry, in no acute distress. Patient denies SI, HI, AVH and pain. Patient made aware of Q15 minute rounds and Rover and Officer presence for their safety. Patient instructed to come to me with needs or concerns.   

## 2019-12-26 NOTE — ED Notes (Signed)
Pt ambulated to restroom with no difficulty; while pt in restroom, linens changed and warm blanket provided for pt, who requested light to be turned off and door closed.

## 2019-12-27 DIAGNOSIS — K7200 Acute and subacute hepatic failure without coma: Secondary | ICD-10-CM | POA: Diagnosis not present

## 2019-12-27 DIAGNOSIS — E119 Type 2 diabetes mellitus without complications: Secondary | ICD-10-CM | POA: Diagnosis not present

## 2019-12-27 DIAGNOSIS — N390 Urinary tract infection, site not specified: Secondary | ICD-10-CM | POA: Diagnosis not present

## 2019-12-27 DIAGNOSIS — U071 COVID-19: Secondary | ICD-10-CM | POA: Diagnosis not present

## 2019-12-27 DIAGNOSIS — Z87891 Personal history of nicotine dependence: Secondary | ICD-10-CM | POA: Diagnosis not present

## 2019-12-27 DIAGNOSIS — F0281 Dementia in other diseases classified elsewhere with behavioral disturbance: Secondary | ICD-10-CM | POA: Diagnosis not present

## 2019-12-27 DIAGNOSIS — G936 Cerebral edema: Secondary | ICD-10-CM | POA: Diagnosis not present

## 2019-12-27 DIAGNOSIS — C7931 Secondary malignant neoplasm of brain: Secondary | ICD-10-CM | POA: Diagnosis not present

## 2019-12-27 DIAGNOSIS — G309 Alzheimer's disease, unspecified: Secondary | ICD-10-CM | POA: Diagnosis not present

## 2019-12-27 DIAGNOSIS — R4689 Other symptoms and signs involving appearance and behavior: Secondary | ICD-10-CM | POA: Diagnosis present

## 2019-12-27 DIAGNOSIS — Z79899 Other long term (current) drug therapy: Secondary | ICD-10-CM | POA: Diagnosis not present

## 2019-12-27 NOTE — ED Notes (Signed)
Hourly rounding reveals patient in room. No complaints, stable, in no acute distress. Q15 minute rounds and monitoring via Rover and Officer to continue.   

## 2019-12-27 NOTE — ED Notes (Signed)
Patient is eating lunch, no signs of distress, will continue to monitor.

## 2019-12-27 NOTE — ED Notes (Signed)
IVC/Pending Placement 

## 2019-12-27 NOTE — ED Notes (Signed)
Patient up to bathroom, He is steady on feet, no signs of distress, will continue to monitor.

## 2019-12-27 NOTE — ED Notes (Signed)
Patient sitting on side of bed, took po pills, Nurse ask if He needed anything or had any concerns and He declined, states " i'm ok" Patient is calm and cooperative, will continue to monitor.

## 2019-12-27 NOTE — ED Notes (Signed)
Pt. Sitting up in bed eating evening tray.  Pt. Requested and meal was heated up for patient.  Pt. Also requested and was given drink.  Pt. Appears to be in good mood.  Pt. Has no further questions or concerns at this time.

## 2019-12-27 NOTE — ED Notes (Signed)
Patient took po medications, He is pleasant, cooperative, no signs of distress, Patient ate 100% of breakfast and had juice, nurse did cover him with warm blanket, He said " Thank you' Patient is forgetful, and nurse let him know that He was at [facility], q 15 minute checks for safety, denies Si/hi or avh.

## 2019-12-28 DIAGNOSIS — G309 Alzheimer's disease, unspecified: Secondary | ICD-10-CM | POA: Diagnosis present

## 2019-12-28 DIAGNOSIS — F0280 Dementia in other diseases classified elsewhere without behavioral disturbance: Secondary | ICD-10-CM

## 2019-12-28 DIAGNOSIS — K7200 Acute and subacute hepatic failure without coma: Secondary | ICD-10-CM | POA: Diagnosis not present

## 2019-12-28 DIAGNOSIS — R410 Disorientation, unspecified: Secondary | ICD-10-CM

## 2019-12-28 NOTE — ED Notes (Signed)
Meal tray placed at bedside. Pt sleeping at this time.

## 2019-12-28 NOTE — ED Notes (Signed)
Patient alert, warm and dry, in no acute distress. Patient will not respond to questions concerning SI, HI, AVH and pain. Patient made aware of Q15 minute rounds and Engineer, drilling presence for their safety. Patient instructed to come to me with needs or concerns.

## 2019-12-28 NOTE — ED Notes (Signed)
Report given to Gary RN 

## 2019-12-28 NOTE — ED Notes (Signed)
Hourly rounding reveals patient sleeping in room. No complaints, stable, in no acute distress. Q15 minute rounds and monitoring via Rover and Officer to continue.  

## 2019-12-28 NOTE — ED Notes (Signed)
Report to include situation, background, assessment and recommendations from [facility]. Patient sleeping, respirations regular and unlabored. Q15 minute rounds and Officer observation to continue.

## 2019-12-28 NOTE — ED Notes (Signed)
Pt. Indicated he was hungry, pt. Given meal tray and drink.

## 2019-12-28 NOTE — ED Notes (Signed)
Gave food tray with juice. 

## 2019-12-28 NOTE — Consult Note (Signed)
[facility] Face-to-Face Psychiatry Consult   Reason for Consult:  Capacity  Referring Physician:  EDP Patient Identification: Bryan Buck MRN:  [id number] Principal Diagnosis: Dementia in Alzheimer's disease [facility]) Diagnosis:  Principal Problem:   Dementia in Alzheimer's disease ([place])  Total Time spent with patient: 1 hour  Subjective:   Bryan Buck is a 86 y.o. male patient admitted with dementia/placement, consult for capacity.  Patient seen and evaluated in person by this provider for capacity.  Patient unable to see where he is at for the circumstances.  He is also unable to describe where he was previously.  He is alert but needs assistance with his ADLs.  Patient does not have capacity to make medical decisions at this time due to cognitive decline and lack of ability.  HPI per previous evaluation: 86 year old male patient with Alzheimer's dementia,  edema of the brain, Covid positive, and also possible UTI who was sent back from SNF facility due to agitation.  Patient had a calm night and was observed to be resting calmly this morning.  Psychiatry was consulted for medication recommendations.  From chart review can see patient was somewhat agitated and would likely benefit from use of medications to help control his agitation, confusion.   Patient will benefit from Seroquel 25 mg 3 times daily either standing or as needed for agitation.   Past Psychiatric History: dementia  Risk to Self:  unable to care for himself Risk to Others:  none Prior Inpatient Therapy:  none Prior Outpatient Therapy:  none  Past Medical History:  Past Medical History:  Diagnosis Date  . Cancer ([place])    colon  . Diabetes mellitus without complication ([place])   . Generalized headaches 06/15/2015   pt reported    Past Surgical History:  Procedure Laterality Date  . EYE SURGERY     lens implant right eye  . HERNIA REPAIR    . IRRIGATION AND DEBRIDEMENT KNEE Right 01/16/2017   Procedure:  IRRIGATION AND DEBRIDEMENT KNEE;  Surgeon: Corky Mull, MD;  Location: [facility] ORS;  Service: Orthopedics;  Laterality: Right;  . KNEE ARTHROSCOPY Right 01/16/2017   Procedure: ARTHROSCOPY KNEE;  Surgeon: Corky Mull, MD;  Location: [facility] ORS;  Service: Orthopedics;  Laterality: Right;   Family History: History reviewed. No pertinent family history. Family Psychiatric  History: none Social History:  Social History   Substance and Sexual Activity  Alcohol Use Yes  . Alcohol/week: 6.0 standard drinks  . Types: 6 Cans of beer per week     Social History   Substance and Sexual Activity  Drug Use No    Social History   Socioeconomic History  . Marital status: Divorced    Spouse name: Not on file  . Number of children: Not on file  . Years of education: Not on file  . Highest education level: Not on file  Occupational History  . Not on file  Tobacco Use  . Smoking status: Former Smoker    Packs/day: 2.00    Years: 20.00    Pack years: 40.00    Types: Cigarettes  . Smokeless tobacco: Former Systems developer    Quit date: 06/14/1993  Substance and Sexual Activity  . Alcohol use: Yes    Alcohol/week: 6.0 standard drinks    Types: 6 Cans of beer per week  . Drug use: No  . Sexual activity: Not on file  Other Topics Concern  . Not on file  Social History Narrative  . Not on file  Social Determinants of Health   Financial Resource Strain:   . Difficulty of Paying Living Expenses: Not on file  Food Insecurity:   . Worried About Charity fundraiser in the Last Year: Not on file  . Ran Out of Food in the Last Year: Not on file  Transportation Needs:   . Lack of Transportation (Medical): Not on file  . Lack of Transportation (Non-Medical): Not on file  Physical Activity:   . Days of Exercise per Week: Not on file  . Minutes of Exercise per Session: Not on file  Stress:   . Feeling of Stress : Not on file  Social Connections:   . Frequency of Communication with Friends and Family: Not  on file  . Frequency of Social Gatherings with Friends and Family: Not on file  . Attends Religious Services: Not on file  . Active Member of Clubs or Organizations: Not on file  . Attends Archivist Meetings: Not on file  . Marital Status: Not on file   Additional Social History:    Allergies:  No Known Allergies  Labs: No results found for this or any previous visit (from the past 48 hour(s)).  Current Facility-Administered Medications  Medication Dose Route Frequency Provider Last Rate Last Admin  . acetaminophen (TYLENOL) tablet 500 mg  500 mg Oral Q6H PRN Duffy Bruce, MD      . albuterol (VENTOLIN HFA) 108 (90 Base) MCG/ACT inhaler 2 puff  2 puff Inhalation Q6H PRN Duffy Bruce, MD      . antiseptic oral rinse (BIOTENE) solution 15 mL  15 mL Topical PRN Duffy Bruce, MD      . cephALEXin (KEFLEX) capsule 500 mg  500 mg Oral Q8H Duffy Bruce, MD   500 mg at 12/28/19 0615  . dexamethasone (DECADRON) tablet 2 mg  2 mg Oral BID Duffy Bruce, MD   2 mg at 12/28/19 1057  . polyvinyl alcohol (LIQUIFILM TEARS) 1.4 % ophthalmic solution 1 drop  1 drop Both Eyes QID PRN Duffy Bruce, MD      . QUEtiapine (SEROQUEL) tablet 25 mg  25 mg Oral TID Delman Kitten, MD   25 mg at 12/28/19 1057   Current Outpatient Medications  Medication Sig Dispense Refill  . albuterol (PROAIR HFA) 108 (90 Base) MCG/ACT inhaler Inhale 2 puffs into the lungs every 6 (six) hours as needed for wheezing or shortness of breath.    Marland Kitchen antiseptic oral rinse (BIOTENE) LIQD Apply 15 mLs topically as needed for dry mouth.    . Morphine Sulfate (MORPHINE CONCENTRATE) 10 MG/0.5ML SOLN concentrated solution Take 0.5 mLs (10 mg total) by mouth every 2 (two) hours as needed for moderate pain, severe pain, anxiety or shortness of breath. 180 mL 0  . acetaminophen (TYLENOL) 500 MG tablet Take 500 mg by mouth every 6 (six) hours as needed for mild pain or fever.     . cephALEXin (KEFLEX) 500 MG capsule Take  1 capsule (500 mg total) by mouth 2 (two) times daily. 14 capsule 0  . dexamethasone (DECADRON) 2 MG tablet Take 1 tablet (2 mg total) by mouth 2 (two) times daily. 60 tablet 0  . polyvinyl alcohol (LIQUIFILM TEARS) 1.4 % ophthalmic solution Place 1 drop into both eyes 4 (four) times daily as needed for dry eyes. 15 mL 0    Musculoskeletal: Strength & Muscle Tone: decreased Gait & Station: normal Patient leans: N/A  Psychiatric Specialty Exam: Physical Exam  Nursing note and vitals reviewed.  Constitutional: He appears well-developed.  HENT:  Head: Normocephalic.  Respiratory: Effort normal.  Musculoskeletal:     Cervical back: Normal range of motion.  Neurological: He is alert.  Psychiatric: His speech is normal and behavior is normal. Judgment and thought content normal. His mood appears anxious. His affect is blunt. Cognition and memory are impaired.    Review of Systems  Psychiatric/Behavioral: The patient is nervous/anxious.   All other systems reviewed and are negative.   Blood pressure (!) 141/81, pulse 72, temperature (!) 97.5 F (36.4 C), temperature source Oral, resp. rate 20, height 6' (1.829 m), weight 79.1 kg, SpO2 98 %.Body mass index is 23.65 kg/m.  General Appearance: Casual  Eye Contact:  Good  Speech:  Normal Rate  Volume:  Normal  Mood:  Anxious  Affect:  Blunt  Thought Process:  Confused  Orientation:  Other:  person  Thought Content:  poor memory, difficult to discern  Suicidal Thoughts:  No  Homicidal Thoughts:  No  Memory:  Immediate;   Poor Recent;   Poor Remote;   Poor  Judgement:  Impaired  Insight:  Lacking  Psychomotor Activity:  Decreased  Concentration:  Concentration: Poor and Attention Span: Poor  Recall:  Poor  Fund of Knowledge:  Poor  Language:  Fair  Akathisia:  No  Handed:  Right  AIMS (if indicated):     Assets:  Leisure Time Resilience  ADL's:  Impaired  Cognition:  Impaired,  Moderate  Sleep:      86 year old male  admitted for agitation with history of dementia and cerebral edema.  Diagnosed with an infection which is being treated.  He is alert but not oriented.  Inability to make any decisions at this time especially medical ones.  Needs assistance with ADLs, skilled facility recommended.  Treatment Plan Summary: Dementia, Alzheimer's type: -Continue Seroquel 25 mg TID  Capacity: Patient does not have capacity to make medical decisions, see assessment above  Disposition: No evidence of imminent risk to self or others at present.   Patient does not meet criteria for psychiatric inpatient admission.  Waylan Boga, NP 12/28/2019 2:35 PM

## 2019-12-28 NOTE — ED Notes (Signed)
Meal tray provided.

## 2019-12-28 NOTE — ED Notes (Signed)
Hourly rounding reveals patient awake in room. No complaints, stable, in no acute distress. Q15 minute rounds and monitoring via Rover and Officer to continue.  

## 2019-12-28 NOTE — ED Notes (Signed)
Hourly rounding reveals patient asleep in room. No complaints, stable, in no acute distress. Q15 minute rounds and monitoring via Rover and Officer to continue.  

## 2019-12-29 DIAGNOSIS — K7200 Acute and subacute hepatic failure without coma: Secondary | ICD-10-CM | POA: Diagnosis not present

## 2019-12-29 NOTE — ED Notes (Signed)
Dinner tray provided

## 2019-12-29 NOTE — ED Notes (Signed)
Hourly rounding reveals patient sleeping in room. No complaints, stable, in no acute distress. Q15 minute rounds and monitoring via Rover and Officer to continue.  

## 2019-12-29 NOTE — ED Provider Notes (Signed)
-----------------------------------------   6:02 AM on 12/29/2019 -----------------------------------------   Blood pressure 133/64, pulse 86, temperature 97.8 F (36.6 C), temperature source Oral, resp. rate 16, height 1.829 m (6'), weight 79.1 kg, SpO2 99 %.  The patient is calm and cooperative at this time.  There have been no acute events since the last update.  Awaiting disposition plan from Behavioral Medicine and/or Social Work team(s).   Hinda Kehr, MD 12/29/19 [phone]

## 2019-12-29 NOTE — ED Notes (Signed)
Hourly rounding reveals patient awake in room. No complaints, stable, in no acute distress. Q15 minute rounds and monitoring via Rover and Officer to continue.  

## 2019-12-29 NOTE — ED Notes (Signed)
Hourly rounding reveals patient sleeping in hall bed. No complaints, stable, in no acute distress. Q15 minute rounds and monitoring via Rover and Officer to continue.  

## 2019-12-29 NOTE — ED Notes (Signed)
Assumed care of patient, patient awakens to name but very sleepy. Awaiting placement. Currently calm and cooperative laying in bed. Safety maintained. Will continue to monitor.

## 2019-12-29 NOTE — ED Notes (Signed)
Patient out of bed to shower and change clothes. Bed linen changed.

## 2019-12-30 DIAGNOSIS — K7200 Acute and subacute hepatic failure without coma: Secondary | ICD-10-CM | POA: Diagnosis not present

## 2019-12-30 NOTE — ED Notes (Signed)
Status remains the same IVC/ Pending placement

## 2019-12-30 NOTE — ED Notes (Signed)
Pt transferred into ED BHU room 1   Patient assigned to appropriate care area. Patient oriented to unit/care area: Informed that, for his safety, care areas are designed for safety and monitored by security cameras at all times; Visiting hours, covid restrictions and phone times explained to patient. Patient verbalizes understanding, and verbal contract for safety obtained  He denies pain  Assessment completed

## 2019-12-30 NOTE — ED Notes (Signed)
Report to include Situation, Background, Assessment, and Recommendations received from Amy RN. Patient alert and disoriented X 3, warm and dry, in no acute distress. Unable to assess SI, HI, AVH and pain. Patient made aware of Q15 minute rounds and security cameras for their safety. Patient instructed to come to me with needs or concerns.

## 2019-12-30 NOTE — ED Notes (Signed)
BEHAVIORAL HEALTH ROUNDING Patient sleeping: No. Patient alert: yes Behavior appropriate: Yes.  ; If no, describe:  Nutrition and fluids offered: yes Toileting and hygiene offered: Yes  Sitter present: q15 minute observations and security camera monitoring   

## 2019-12-30 NOTE — ED Notes (Signed)
Hourly rounding reveals patient in room. No complaints, stable, in no acute distress. Q15 minute rounds and monitoring via Security Cameras to continue. 

## 2019-12-30 NOTE — Care Management (Signed)
RN CM: Received voicemail from Milus Glazier, [facility] Social worker, following up on placement.   RN CM: Barbette Or back and left voicemail. Will continue to search for possible memory care unit.

## 2019-12-30 NOTE — ED Notes (Signed)
ED BHU Is the patient under IVC or is there intent for IVC: Yes.   Is the patient medically cleared: Yes.   Is there vacancy in the ED BHU: Yes.   Is the population mix appropriate for patient: Yes.   Is the patient awaiting placement in inpatient or outpatient setting: Yes.   Has the patient had a psychiatric consult: Yes.   Survey of unit performed for contraband, proper placement and condition of furniture, tampering with fixtures in bathroom, shower, and each patient room: Yes.  ; Findings:  APPEARANCE/BEHAVIOR Calm and cooperative NEURO ASSESSMENT Orientation: oriented to self and place  Reoriented to time and situation   Denies pain Hallucinations: No.None noted (Hallucinations) denies  Speech: Normal Gait: normal RESPIRATORY ASSESSMENT Even  Unlabored respirations  CARDIOVASCULAR ASSESSMENT Pulses equal   regular rate  Skin warm and dry   GASTROINTESTINAL ASSESSMENT no GI complaint EXTREMITIES Full ROM  PLAN OF CARE Provide calm/safe environment. Vital signs assessed twice daily. ED BHU Assessment once each 12-hour shift. Assure the ED provider has rounded once each shift. Provide and encourage hygiene. Provide redirection as needed. Assess for escalating behavior; address immediately and inform ED provider.  Assess family dynamic and appropriateness for visitation as needed: Yes.  ; If necessary, describe findings:  Educate the patient/family about BHU procedures/visitation: Yes.  ; If necessary, describe findings:

## 2019-12-30 NOTE — ED Notes (Signed)
BEHAVIORAL HEALTH ROUNDING Patient sleeping: No. Patient alert : yes Behavior appropriate: Yes.  ; If no, describe:  Nutrition and fluids offered: yes Toileting and hygiene offered: Yes  Sitter present: q15 minute observations and security camera monitoring    ENVIRONMENTAL ASSESSMENT Potentially harmful objects out of patient reach: Yes.   Personal belongings secured: Yes.   Patient dressed in hospital provided attire only: Yes.   Plastic bags out of patient reach: Yes.   Patient care equipment (cords, cables, call bells, lines, and drains) shortened, removed, or accounted for: Yes.   Equipment and supplies removed from bottom of stretcher: Yes.   Potentially toxic materials out of patient reach: Yes.   Sharps container removed or out of patient reach: Yes.   

## 2019-12-30 NOTE — ED Provider Notes (Signed)
-----------------------------------------   9:11 AM on 12/30/2019 -----------------------------------------   Blood pressure (!) 150/83, pulse 91, temperature 97.9 F (36.6 C), temperature source Oral, resp. rate 17, height 6' (1.829 m), weight 79.1 kg, SpO2 98 %.  The patient is calm and cooperative at this time.  There have been no acute events since the last update.  Awaiting disposition plan from Behavioral Medicine and/or Social Work team(s).   Arta Silence, MD 12/30/19 [phone]

## 2019-12-30 NOTE — ED Notes (Signed)
3RD  SET OF  IVC PAPERS  DONE  PT  MOVED  TO  BHU  UNIT  PENDING  PLACEMENT

## 2019-12-31 DIAGNOSIS — K7200 Acute and subacute hepatic failure without coma: Secondary | ICD-10-CM | POA: Diagnosis not present

## 2019-12-31 NOTE — ED Notes (Signed)
Hourly rounding reveals patient in room. No complaints, stable, in no acute distress. Q15 minute rounds and monitoring via Security Cameras to continue. 

## 2019-12-31 NOTE — ED Notes (Signed)
Meal tray provided.
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
## 2019-12-31 NOTE — ED Notes (Signed)
Hourly rounding reveals patient sleeping in room. No complaints, stable, in no acute distress. Q15 minute rounds and monitoring via Security Cameras to continue. 

## 2019-12-31 NOTE — ED Notes (Signed)
IVC/  PENDING  PLACEMENT 

## 2019-12-31 NOTE — ED Provider Notes (Signed)
-----------------------------------------   6:06 AM on 12/31/2019 -----------------------------------------   Blood pressure (!) 132/57, pulse 86, temperature (!) 97.5 F (36.4 C), temperature source Oral, resp. rate 18, height 6' (1.829 m), weight 79.1 kg, SpO2 99 %.  The patient is sleeping at this time.  There have been no acute events since the last update.  Awaiting disposition plan from Behavioral Medicine and/or Social Work team(s).   Paulette Blanch, MD 12/31/19 [phone]

## 2019-12-31 NOTE — Care Management (Addendum)
RN CM: Left voicemail for brother to confirm financial information and need for private pay to memory care.   Incoming call from Milus Glazier, APS SW states DSS confirmed patients brother was POA and patient would not likely qualify for any medicaid.   Faxed FL2 to [facility] @ [phone] and [facility] @ [phone] seeking placement. Sent updated Fl2 via Epic to [employer].

## 2019-12-31 NOTE — ED Notes (Signed)
Report to include Situation, Background, Assessment, and Recommendations received from Amy RN. Patient alert and oriented, warm and dry, in no acute distress. Patient denies SI, HI, AVH and pain. Patient made aware of Q15 minute rounds and security cameras for their safety. Patient instructed to come to me with needs or concerns.  

## 2020-01-01 DIAGNOSIS — K7200 Acute and subacute hepatic failure without coma: Secondary | ICD-10-CM | POA: Diagnosis not present

## 2020-01-01 NOTE — ED Provider Notes (Signed)
-----------------------------------------   5:43 AM on 01/01/2020 -----------------------------------------   Blood pressure (!) 150/54, pulse 87, temperature 97.6 F (36.4 C), temperature source Oral, resp. rate 17, height 6' (1.829 m), weight 79.1 kg, SpO2 100 %.  The patient is sleeping at this time.  There have been no acute events since the last update.  Awaiting disposition plan from Behavioral Medicine and/or Social Work team(s).   Paulette Blanch, MD 01/01/20 [phone]

## 2020-01-01 NOTE — ED Notes (Signed)
Hourly rounding reveals patient in room. No complaints, stable, in no acute distress. Q15 minute rounds and monitoring via Security Cameras to continue. 

## 2020-01-01 NOTE — ED Notes (Signed)
IVC/Pending Placement 

## 2020-01-01 NOTE — ED Notes (Signed)
Hourly rounding reveals patient sleeping in room. No complaints, stable, in no acute distress. Q15 minute rounds and monitoring via Security Cameras to continue. 

## 2020-01-01 NOTE — ED Notes (Signed)

## 2020-01-01 NOTE — ED Notes (Signed)
BEHAVIORAL HEALTH ROUNDING Patient sleeping: No. Patient alert: yes Behavior appropriate: Yes.  ; If no, describe:  Nutrition and fluids offered: yes Toileting and hygiene offered: Yes  Sitter present: q15 minute observations and security camera monitoring   

## 2020-01-01 NOTE — ED Notes (Signed)
Report to include Situation, Background, Assessment, and Recommendations received from Amy RN. Patient alert and oriented, warm and dry, in no acute distress. Patient denies SI, HI, AVH and pain. Patient made aware of Q15 minute rounds and security cameras for their safety. Patient instructed to come to me with needs or concerns.

## 2020-01-01 NOTE — ED Notes (Signed)
BEHAVIORAL HEALTH ROUNDING Patient sleeping: Yes.   Patient alert and oriented: eyes closed  Appears asleep Behavior appropriate: Yes.  ; If no, describe:  Nutrition and fluids offered: Yes  Toileting and hygiene offered: sleeping Sitter present: q 15 minute observations and security camera monitoring  

## 2020-01-01 NOTE — ED Notes (Signed)
Patient observed lying in bed with eyes closed  Even, unlabored respirations observed   NAD pt appears to be sleeping  I will continue to monitor along with every 15 minute visual observations and ongoing security camera monitoring    

## 2020-01-01 NOTE — ED Notes (Signed)
ED BHU Is the patient under IVC or is there intent for IVC: Yes.   Is the patient medically cleared: Yes.   Is there vacancy in the ED BHU: Yes.   Is the population mix appropriate for patient: Yes.   Is the patient awaiting placement in inpatient or outpatient setting: Yes.   He is awaiting safe placement Has the patient had a psychiatric consult: Yes.   Survey of unit performed for contraband, proper placement and condition of furniture, tampering with fixtures in bathroom, shower, and each patient room: Yes.  ; Findings:  APPEARANCE/BEHAVIOR Calm and cooperative NEURO ASSESSMENT Orientation: oriented to self  Attempts made to reorient him to time, place and situation   He verbalizes  " What time is it (I told him the correct time)  I wonder why my father has not come by to visit yet - he is 39 and I get worried about him"   Pt informed of time, date and his age    Reassurance provided  Denies pain Hallucinations: No.None noted (Hallucinations) Speech: Normal Gait: normal RESPIRATORY ASSESSMENT Even  Unlabored respirations  CARDIOVASCULAR ASSESSMENT Pulses equal   regular rate  Skin warm and dry   GASTROINTESTINAL ASSESSMENT no GI complaint EXTREMITIES Full ROM  PLAN OF CARE Provide calm/safe environment. Vital signs assessed twice daily. ED BHU Assessment once each 12-hour shift. Assure the ED provider has rounded once each shift. Provide and encourage hygiene. Provide redirection as needed. Assess for escalating behavior; address immediately and inform ED provider.  Assess family dynamic and appropriateness for visitation as needed: Yes.  ; If necessary, describe findings:  Educate the patient/family about BHU procedures/visitation: Yes.  ; If necessary, describe findings:

## 2020-01-01 NOTE — ED Notes (Signed)
Pt ambulated to bathroom 

## 2020-01-02 DIAGNOSIS — K7200 Acute and subacute hepatic failure without coma: Secondary | ICD-10-CM | POA: Diagnosis not present

## 2020-01-02 NOTE — ED Notes (Signed)
Hourly rounding reveals patient in room. No complaints, stable, in no acute distress. Q15 minute rounds and monitoring via Security Cameras to continue. 

## 2020-01-02 NOTE — ED Notes (Signed)
Patient is calm and cooperative, no signs of distress, will continue to monitor.  

## 2020-01-02 NOTE — ED Notes (Addendum)
Patient is sleeping, no signs of distress, will continue to monitor, q 15 minute checks and camera surveillance in progress for safety.

## 2020-01-02 NOTE — ED Notes (Signed)
IVC/  PENDING  PLACEMENT 

## 2020-01-02 NOTE — ED Notes (Signed)
Patient ate 75 % of supper and beverage.

## 2020-01-02 NOTE — ED Notes (Signed)
Offered pt snack and drink, pt stated did not want anything to eat.AS

## 2020-01-02 NOTE — ED Notes (Signed)
Patient ate 75% of lunch and had beverage, up to bathroom, no signs of distress.

## 2020-01-02 NOTE — TOC Progression Note (Signed)
Transition of Care [facility]) - Progression Note    Patient Details  Name: Oluwatobiloba Ginnetti MRN: [id number] Date of Birth: 1934-04-29  [facility]) CM/SW [place], RN Phone Number: 01/02/2020, 4:14 PM  Clinical Narrative:    Tawni Carnes to [place] attention Debbie for review for admission to locked unit.    Expected Discharge Plan: [place]    Expected Discharge Plan and Services Expected Discharge Plan: [place] arrangements for the past 2 months: Single Family Home                                       Social Determinants of Health (SDOH) Interventions    Readmission Risk Interventions No flowsheet data found.

## 2020-01-02 NOTE — ED Notes (Signed)
Patient is resting with eyes closed, can easily arouse, He is without behavioral issues, no signs of distress, q 15 minute checks and camera surveillance in progress for safety.

## 2020-01-03 DIAGNOSIS — K7200 Acute and subacute hepatic failure without coma: Secondary | ICD-10-CM | POA: Diagnosis not present

## 2020-01-03 NOTE — ED Notes (Signed)
BEHAVIORAL HEALTH ROUNDING Patient sleeping: No. Patient alert: yes Behavior appropriate: Yes.  ; If no, describe:  Nutrition and fluids offered: yes Toileting and hygiene offered: Yes  Sitter present: q15 minute observations and security camera monitoring   

## 2020-01-03 NOTE — ED Notes (Signed)
He has ambulated to and from the BR with a steady gait  

## 2020-01-03 NOTE — ED Notes (Signed)
Patient observed lying in bed with eyes closed  Even, unlabored respirations observed   NAD pt appears to be sleeping  I will continue to monitor along with every 15 minute visual observations and ongoing security camera monitoring    

## 2020-01-03 NOTE — ED Notes (Signed)
Pt. Resting in room, no complaints or concerns at this time.

## 2020-01-03 NOTE — ED Provider Notes (Signed)
-----------------------------------------   1:44 AM on 01/03/2020 -----------------------------------------   Blood pressure 116/64, pulse 87, temperature 97.8 F (36.6 C), temperature source Oral, resp. rate 15, height 1.829 m (6'), weight 79.1 kg, SpO2 99 %.  The patient is calm and cooperative at this time.  There have been no acute events since the last update.  Awaiting disposition plan from Behavioral Medicine and/or Social Work team(s).   Hinda Kehr, MD 01/03/20 [phone]

## 2020-01-03 NOTE — ED Notes (Signed)
ED BHU  Is the patient under IVC or is there intent for IVC: Yes.   Is the patient medically cleared: Yes.   Is there vacancy in the ED BHU: Yes.   Is the population mix appropriate for patient: Yes.   Is the patient awaiting placement in inpatient or outpatient setting: Yes.  Awaiting safe placement  Has the patient had a psychiatric consult: Yes.   Survey of unit performed for contraband, proper placement and condition of furniture, tampering with fixtures in bathroom, shower, and each patient room: Yes.  ; Findings:  APPEARANCE/BEHAVIOR Calm and cooperative NEURO ASSESSMENT Orientation: oriented to self, place  Attempts made to orient him to time and situation  Denies pain Hallucinations: No.None noted (Hallucinations)  denies Speech: Normal Gait: normal RESPIRATORY ASSESSMENT Even  Unlabored respirations  CARDIOVASCULAR ASSESSMENT Pulses equal   regular rate  Skin warm and dry   GASTROINTESTINAL ASSESSMENT no GI complaint EXTREMITIES Full ROM  PLAN OF CARE Provide calm/safe environment. Vital signs assessed twice daily. ED BHU Assessment once each 12-hour shift. Assure the ED provider has rounded once each shift. Provide and encourage hygiene. Provide redirection as needed. Assess for escalating behavior; address immediately and inform ED provider.  Assess family dynamic and appropriateness for visitation as needed: Yes.  ; If necessary, describe findings:  Educate the patient/family about BHU procedures/visitation: Yes.  ; If necessary, describe findings:

## 2020-01-03 NOTE — Care Management (Addendum)
RN CM: Incoming call from Milus Glazier, [place] Social worker requesting update on discharge. RN CM advised patient remains in ED and search continues for memory care unit placement. Faxed Fl2 to [facility], [phone] at her request to assist in search.   Still waiting reply from [facility] that was received yesterday.   Call to [facility]-confirmed availability in memory care and faxed FL2 to [phone].

## 2020-01-03 NOTE — ED Notes (Signed)
Breakfast tray given to pt 

## 2020-01-03 NOTE — ED Notes (Signed)
IVC/Pending Placement 

## 2020-01-03 NOTE — ED Notes (Signed)
BEHAVIORAL HEALTH ROUNDING Patient sleeping: Yes.   Patient alert and oriented: eyes closed  Appears asleep Behavior appropriate: Yes.  ; If no, describe:  Nutrition and fluids offered: Yes  Toileting and hygiene offered: sleeping Sitter present: q 15 minute observations and security camera monitoring  

## 2020-01-03 NOTE — ED Notes (Signed)

## 2020-01-03 NOTE — Care Management (Signed)
RN CM: Call to [facility] [place]-confirmed patient was accepted into memory care unit. Will need brother, financial POA, to call [facility] and finalize financial aspects.   Call to brother, [name], to update of need to contact [facility] [place] agreed to do so however advised that patients neighbor, Lynelle Smoke has offered to allow patient to live with her and care for him.   RN CM called Tammy @ [phone], no answer and no option to leave voicemail. Brother will continue to pursue placement at [facility] until contact made with Tammy.

## 2020-01-03 NOTE — ED Notes (Signed)
Pt given lunch meal tray. 

## 2020-01-04 DIAGNOSIS — K7200 Acute and subacute hepatic failure without coma: Secondary | ICD-10-CM | POA: Diagnosis not present

## 2020-01-04 NOTE — ED Notes (Signed)
Pt given lunch tray.

## 2020-01-04 NOTE — ED Notes (Signed)
Pt given meal tray.

## 2020-01-04 NOTE — ED Notes (Signed)
Pt. Requested and was given snack tray and drink.

## 2020-01-04 NOTE — ED Notes (Signed)
Cleaned trash in room and chenged pt bed linen, swept and mopped floor and assisted with pt changing soiled pants.AS

## 2020-01-04 NOTE — ED Notes (Signed)
Pt. Up using bathroom, pt. Requested and was given OJ, pt. Returned to room with steady gait.

## 2020-01-04 NOTE — ED Notes (Signed)
Pt. Laying in bed resting.  Pt. Has no questions or concerns.

## 2020-01-04 NOTE — ED Notes (Signed)
Pt. Up using bathroom, pt returned to room with steady gait. 

## 2020-01-04 NOTE — ED Notes (Signed)
Pt. Up using bathroom, pt. Returned to room with steady gait. 

## 2020-01-05 DIAGNOSIS — K7200 Acute and subacute hepatic failure without coma: Secondary | ICD-10-CM | POA: Diagnosis not present

## 2020-01-05 NOTE — ED Notes (Signed)
Pt. Up using bathroom, pt. Returned to room with steady gait. 

## 2020-01-05 NOTE — ED Provider Notes (Signed)
-----------------------------------------   5:59 AM on 01/05/2020 -----------------------------------------   Blood pressure 105/63, pulse 97, temperature 98 F (36.7 C), temperature source Oral, resp. rate 18, height 6' (1.829 m), weight 79.1 kg, SpO2 97 %.  The patient is calm and cooperative at this time.  There have been no acute events since the last update.  Awaiting disposition plan from Behavioral Medicine and/or Social Work team(s).   Paulette Blanch, MD 01/05/20 [phone]

## 2020-01-05 NOTE — ED Notes (Signed)
Hourly rounding reveals patient in room. No complaints, stable, in no acute distress. Q15 minute rounds and monitoring via Security Cameras to continue. 

## 2020-01-05 NOTE — ED Notes (Signed)
Hourly rounding reveals patient sleeping in room. No complaints, stable, in no acute distress. Q15 minute rounds and monitoring via Security Cameras to continue. 

## 2020-01-05 NOTE — ED Notes (Signed)
Pt given breakfast tray

## 2020-01-05 NOTE — ED Notes (Signed)
Pt given meal tray.

## 2020-01-05 NOTE — ED Notes (Signed)
Report to include Situation, Background, Assessment, and Recommendations received from Amy B. RN. Patient alert, warm and dry, in no acute distress. Patient denies SI, HI, AVH and pain. Patient made aware of Q15 minute rounds and security cameras for their safety. Patient instructed to come to me with needs or concerns. 

## 2020-01-05 NOTE — ED Notes (Signed)
Pt offered a shower but declined at this time.

## 2020-01-06 DIAGNOSIS — K7200 Acute and subacute hepatic failure without coma: Secondary | ICD-10-CM | POA: Diagnosis not present

## 2020-01-06 NOTE — ED Notes (Signed)
Hourly rounding reveals patient sleeping in room. No complaints, stable, in no acute distress. Q15 minute rounds and monitoring via Security Cameras to continue. 

## 2020-01-06 NOTE — ED Notes (Signed)
Hourly rounding reveals patient awake in room. No complaints, stable, in no acute distress. Q15 minute rounds and monitoring via Security Cameras to continue. 

## 2020-01-06 NOTE — ED Provider Notes (Signed)
-----------------------------------------   9:02 AM on 01/06/2020 -----------------------------------------   Blood pressure (!) 116/54, pulse 83, temperature (!) 97.4 F (36.3 C), temperature source Oral, resp. rate 16, height 6' (1.829 m), weight 79.1 kg, SpO2 99 %.  The patient is calm and cooperative at this time.  There have been no acute events since the last update.  Awaiting disposition plan from Behavioral Medicine and/or Social Work team(s).   Carrie Mew, MD 01/06/20 [phone]

## 2020-01-06 NOTE — ED Notes (Signed)
Hourly rounding reveals patient asleep in room. No complaints, stable, in no acute distress. Q15 minute rounds and monitoring via Security Cameras to continue. 

## 2020-01-06 NOTE — ED Notes (Signed)
Hourly rounding reveals patient sleeping n room. No complaints, stable, in no acute distress. Q15 minute rounds and monitoring via [employer] to continue.

## 2020-01-06 NOTE — ED Notes (Signed)
Hourly rounding reveals patient sleeping in room. Pt awakened and given breakfast box. No complaints, stable, in no acute distress. Q15 minute rounds and monitoring via [employer] to continue.

## 2020-01-06 NOTE — TOC Progression Note (Signed)
Transition of Care [facility]) - Progression Note    Patient Details  Name: Kilik Burgin MRN: [id number] Date of Birth: 1934-07-26  [facility]) CM/SW Contact  Shelbie Ammons, RN Phone Number: 01/06/2020, 3:32 PM  Clinical Narrative:   RNCM received call from patient's brother [name] to report that he had been over to [facility] to discuss financials with [facility] but due to patient's financial status he is unable to go there.  RNCM placed call to [facility] admin who informed this CM that patient does not have enough in savings to qualify to come there, since they don't accept Medicaid he would be unable to pay for more than 6 months which is what there qualification is.  RNCM returned call to patient's brother and inform of same.     Expected Discharge Plan: [place]    Expected Discharge Plan and Services Expected Discharge Plan: [place] arrangements for the past 2 months: Single Family Home                                       Social Determinants of Health (SDOH) Interventions    Readmission Risk Interventions No flowsheet data found.

## 2020-01-06 NOTE — ED Notes (Signed)
Report to include Situation, Background, Assessment, and Recommendations received from [place]. Patient alert, warm and dry, in no acute distress. Patient denies SI, HI, AVH and pain. Patient made aware of Q15 minute rounds and security cameras for their safety. Patient instructed to come to me with needs or concerns.

## 2020-01-07 DIAGNOSIS — K7200 Acute and subacute hepatic failure without coma: Secondary | ICD-10-CM | POA: Diagnosis not present

## 2020-01-07 NOTE — ED Notes (Signed)
Hourly rounding reveals patient sleeping in room. No complaints, stable, in no acute distress. Q15 minute rounds and monitoring via Security Cameras to continue. 

## 2020-01-07 NOTE — ED Notes (Signed)
BEHAVIORAL HEALTH ROUNDING Patient sleeping: No. Patient alert: yes Behavior appropriate: Yes.  ; If no, describe:  Nutrition and fluids offered: yes Toileting and hygiene offered: Yes  Sitter present: q15 minute observations and security camera monitoring   

## 2020-01-07 NOTE — ED Notes (Signed)
Patient observed lying in bed with eyes closed  Even, unlabored respirations observed   NAD pt appears to be sleeping  I will continue to monitor along with every 15 minute visual observations and ongoing security camera monitoring    

## 2020-01-07 NOTE — ED Notes (Signed)
Report to include Situation, Background, Assessment, and Recommendations received from Amy Teague RN. Patient alert, warm and dry, in no acute distress. Patient denies SI, HI, AVH and pain. Patient made aware of Q15 minute rounds and security cameras for their safety. Patient instructed to come to me with needs or concerns. 

## 2020-01-07 NOTE — ED Notes (Signed)
BEHAVIORAL HEALTH ROUNDING Patient sleeping: Yes.   Patient alert and oriented: eyes closed  Appears asleep Behavior appropriate: Yes.  ; If no, describe:  Nutrition and fluids offered: Yes  Toileting and hygiene offered: sleeping Sitter present: q 15 minute observations and security camera monitoring  

## 2020-01-07 NOTE — ED Notes (Signed)
Pt ambulated to bathroom 

## 2020-01-07 NOTE — ED Notes (Signed)

## 2020-01-07 NOTE — ED Notes (Signed)
Hourly rounding reveals patient awake in room. No complaints, stable, in no acute distress. Q15 minute rounds and monitoring via Security Cameras to continue. 

## 2020-01-07 NOTE — ED Notes (Signed)
ED BHU [place] Is the patient under IVC or is there intent for IVC: Yes.   Is the patient medically cleared: Yes.   Is there vacancy in the ED BHU: Yes.   Is the population mix appropriate for patient: Yes.   Is the patient awaiting placement in inpatient or outpatient setting: Yes.  Social work is looking for him a safe place to live   Has the patient had a psychiatric consult: Yes.  He is cleared by psychiatry  Survey of unit performed for contraband, proper placement and condition of furniture, tampering with fixtures in bathroom, shower, and each patient room: Yes.  ; Findings:  APPEARANCE/BEHAVIOR Calm and cooperative NEURO ASSESSMENT Orientation: oriented to self and place  Reoriented to time and situation  He denies pain Hallucinations: No.None noted (Hallucinations) Speech: Normal Gait: normal RESPIRATORY ASSESSMENT Even  Unlabored respirations  CARDIOVASCULAR ASSESSMENT Pulses equal   regular rate  Skin warm and dry   GASTROINTESTINAL ASSESSMENT no GI complaint EXTREMITIES Full ROM  PLAN OF CARE Provide calm/safe environment. Vital signs assessed twice daily. ED BHU Assessment once each 12-hour shift. Assure the ED provider has rounded once each shift. Provide and encourage hygiene. Provide redirection as needed. Assess for escalating behavior; address immediately and inform ED provider.  Assess family dynamic and appropriateness for visitation as needed: Yes.  ; If necessary, describe findings:  Educate the patient/family about BHU procedures/visitation: Yes.  ; If necessary, describe findings:

## 2020-01-08 DIAGNOSIS — K7200 Acute and subacute hepatic failure without coma: Secondary | ICD-10-CM | POA: Diagnosis not present

## 2020-01-08 NOTE — ED Notes (Signed)
Hourly rounding reveals patient in room. No complaints, stable, in no acute distress. Q15 minute rounds and monitoring via Security Cameras to continue. 

## 2020-01-08 NOTE — ED Notes (Signed)
IVC pending placement 

## 2020-01-08 NOTE — ED Notes (Signed)
Hourly rounding reveals patient sleeping in room. No complaints, stable, in no acute distress. Q15 minute rounds and monitoring via Security Cameras to continue. 

## 2020-01-08 NOTE — ED Notes (Signed)
Hourly rounding reveals patient in rest room. No complaints, stable, in no acute distress. Q15 minute rounds and monitoring via Security Cameras to continue. 

## 2020-01-08 NOTE — ED Notes (Signed)
Pt given clean scrub pants, pt states he had an accident while waiting to use the restroom while another pt was using it.

## 2020-01-08 NOTE — ED Notes (Signed)
Report to include Situation, Background, Assessment, and Recommendations received from Laura RN. Patient alert and oriented, warm and dry, in no acute distress. Unable to assess SI, HI, AVH and pain. Patient made aware of Q15 minute rounds and security cameras for their safety. Patient instructed to come to me with needs or concerns.  

## 2020-01-08 NOTE — ED Notes (Signed)
Patient's family dropped off a christmas present for patient and got update from staff. Patient opened present with staff supervision. Patient received a box of peanut butter crackers which are now being stored at nurses station.

## 2020-01-08 NOTE — ED Provider Notes (Signed)
-----------------------------------------   8:43 AM on 01/08/2020 -----------------------------------------   BP (!) 116/54 (BP Location: Right Arm)   Pulse 86   Temp 97.9 F (36.6 C) (Oral)   Resp 16   Ht 6' (1.829 m)   Wt 79.1 kg   SpO2 97%   BMI 23.65 kg/m   The patient is calm and cooperative at this time.  There have been no acute events since the last update.  Awaiting disposition plan from Social Work team(s).  Awaiting placement.    Lilia Pro., MD 01/08/20 [phone]

## 2020-01-09 DIAGNOSIS — K7200 Acute and subacute hepatic failure without coma: Secondary | ICD-10-CM | POA: Diagnosis not present

## 2020-01-09 NOTE — ED Notes (Signed)
Hourly rounding reveals patient in room. No complaints, stable, in no acute distress. Q15 minute rounds and monitoring via Security Cameras to continue. 

## 2020-01-09 NOTE — TOC Progression Note (Addendum)
Transition of Care [facility]) - Progression Note    Patient Details  Name: Bryan Buck MRN: [id number] Date of Birth: [birth year]  [facility]) CM/SW Contact  Anselm Pancoast, RN Phone Number: 01/09/2020, 4:19 PM  Clinical Narrative:  Call to [name], brother and confirmed that [facility] would not accept patient after meeting with family due to insufficient funds. Neighbor is still willing to accept patient for care in her home however is working on some changes to her home to accommodate patient. RN CM attempted to reach Tammy, neighbor but no answer and no option to leave message. RN CM call to Milus Glazier, SW from [phone] updated with new dc plan for neighbor. SW requested update once patient discharges in order for a home visit to be scheduled.    Expected Discharge Plan: [place]    Expected Discharge Plan and Services Expected Discharge Plan: [place] arrangements for the past 2 months: Single Family Home                                       Social Determinants of Health (SDOH) Interventions    Readmission Risk Interventions No flowsheet data found.

## 2020-01-09 NOTE — ED Provider Notes (Signed)
-----------------------------------------   7:08 AM on 01/09/2020 -----------------------------------------   Blood pressure (!) 134/52, pulse 73, temperature 97.6 F (36.4 C), temperature source Oral, resp. rate (!) 23, height 6' (1.829 m), weight 79.1 kg, SpO2 98 %.  The patient is calm and cooperative at this time.  There have been no acute events since the last update.  Awaiting disposition plan from Behavioral Medicine and/or Social Work team(s).   Arta Silence, MD 01/09/20 [phone]

## 2020-01-10 DIAGNOSIS — K7200 Acute and subacute hepatic failure without coma: Secondary | ICD-10-CM | POA: Diagnosis not present

## 2020-01-10 NOTE — ED Notes (Signed)
Patient up to toilet. Patient signaling RN after using restroom. Patient asked RN for snacks and was informed that only water is allowed after [id number] per policy. Patient verbalized understanding. Patient returned to bedroom.

## 2020-01-10 NOTE — ED Notes (Signed)
Dinner tray provided

## 2020-01-10 NOTE — ED Notes (Signed)
Patient is eating breakfast, no signs of distress, He is alert and oriented, took morning medications without difficulty.

## 2020-01-10 NOTE — ED Notes (Signed)
Patient ate 100% of lunch and beverage, no signs of distress, Patient with food all over scrubs, offered Patient to take a shower and He refused at this time. Patient denies Si/hi or avh. q 15 minute checks and camera surveillance in progress for safety.

## 2020-01-10 NOTE — ED Provider Notes (Signed)
-----------------------------------------   6:00 AM on 01/10/2020 -----------------------------------------   Blood pressure 125/65, pulse (!) 115, temperature (!) 97.5 F (36.4 C), temperature source Oral, resp. rate 18, height 6' (1.829 m), weight 79.1 kg, SpO2 97 %.  The patient is sleeping at this time.  There have been no acute events since the last update.  Awaiting disposition plan from Behavioral Medicine and/or Social Work team(s).   Paulette Blanch, MD 01/10/20 0600

## 2020-01-10 NOTE — ED Notes (Signed)
Pt. Laying in bed sleeping.

## 2020-01-11 DIAGNOSIS — K7200 Acute and subacute hepatic failure without coma: Secondary | ICD-10-CM | POA: Diagnosis not present

## 2020-01-11 NOTE — ED Notes (Signed)
BEHAVIORAL HEALTH ROUNDING Patient sleeping: No. Patient alert: yes Behavior appropriate: Yes.  ; If no, describe:  Nutrition and fluids offered: yes Toileting and hygiene offered: Yes  Sitter present: q15 minute observations and security camera monitoring   

## 2020-01-11 NOTE — ED Notes (Signed)
BEHAVIORAL HEALTH ROUNDING Patient sleeping: Yes.   Patient alert and oriented: eyes closed  Appears asleep Behavior appropriate: Yes.  ; If no, describe:  Nutrition and fluids offered: Yes  Toileting and hygiene offered: sleeping Sitter present: q 15 minute observations and security camera monitoring  

## 2020-01-11 NOTE — ED Notes (Signed)
Pt. Up asking about a box of candy, pt. Told this nurse knows nothing about box of candy, pt. Offered what the unit has, pt. Declined and returned to room.

## 2020-01-11 NOTE — ED Notes (Signed)
Patient observed lying in bed with eyes closed  Even, unlabored respirations observed   NAD pt appears to be sleeping  I will continue to monitor along with every 15 minute visual observations and ongoing security camera monitoring    

## 2020-01-11 NOTE — ED Notes (Signed)
ED BHU Is the patient under IVC or is there intent for IVC: Yes.   Is the patient medically cleared: Yes.   Is there vacancy in the ED BHU: Yes.   Is the population mix appropriate for patient: Yes.   Is the patient awaiting placement in inpatient or outpatient setting: Yes.  Social work consult is in progress -  finding him safe placement   Has the patient had a psychiatric consult: Yes.  Cleared by psychiatry  Survey of unit performed for contraband, proper placement and condition of furniture, tampering with fixtures in bathroom, shower, and each patient room: Yes.  ; Findings:  APPEARANCE/BEHAVIOR Calm and cooperative NEURO ASSESSMENT Orientation: oriented to self and place  Reoriented to time and situation   Denies pain Hallucinations: No.None noted (Hallucinations)  denies Speech: Normal Gait: normal RESPIRATORY ASSESSMENT Even  Unlabored respirations  CARDIOVASCULAR ASSESSMENT Pulses equal   regular rate  Skin warm and dry   GASTROINTESTINAL ASSESSMENT no GI complaint EXTREMITIES Full ROM  PLAN OF CARE Provide calm/safe environment. Vital signs assessed twice daily. ED BHU Assessment once each 12-hour shift.  Assure the ED provider has rounded once each shift. Provide and encourage hygiene. Provide redirection as needed. Assess for escalating behavior; address immediately and inform ED provider.  Assess family dynamic and appropriateness for visitation as needed: Yes.  ; If necessary, describe findings:  Educate the patient/family about BHU procedures/visitation: Yes.  ; If necessary, describe findings:

## 2020-01-11 NOTE — ED Notes (Addendum)
He has ambulated to and from the BR with a steady gait   Pt visualized with NAD  No verbalized needs or concerns at this time  Continue to monitor

## 2020-01-11 NOTE — ED Provider Notes (Signed)
-----------------------------------------   6:32 AM on 01/11/2020 -----------------------------------------   Blood pressure (!) 123/51, pulse 90, temperature 97.6 F (36.4 C), temperature source Oral, resp. rate 20, height 6' (1.829 m), weight 79.1 kg, SpO2 95 %.  The patient is sleeping at this time.  There have been no acute events since the last update.  Awaiting disposition plan from Behavioral Medicine and/or Social Work team(s).   Paulette Blanch, MD 01/11/20 [phone]

## 2020-01-11 NOTE — ED Notes (Signed)
Pt. Up using bathroom, pt. Returned to room with steady gait. 

## 2020-01-11 NOTE — ED Notes (Signed)
He has awakened - his clothing and bed are saturated with urine  - shower offered  Clean clothing provided  Linens changed and floor mopped by me

## 2020-01-11 NOTE — ED Notes (Signed)
Pt. Up using bathroom.  Pt. Came to the nursing station asking about a box of candy.  Patient told, this nurse unaware of box of candy, pt. Offered and gave graham crackers and cup of OJ to patient, pt. Satisfied.

## 2020-01-11 NOTE — ED Notes (Signed)

## 2020-01-12 DIAGNOSIS — K7200 Acute and subacute hepatic failure without coma: Secondary | ICD-10-CM | POA: Diagnosis not present

## 2020-01-12 NOTE — ED Notes (Signed)
IVC plan to d/c Monday possibly to home on hospice

## 2020-01-12 NOTE — ED Provider Notes (Signed)
-----------------------------------------   5:23 AM on 01/12/2020 -----------------------------------------   Blood pressure 124/70, pulse 81, temperature 98.7 F (37.1 C), temperature source Oral, resp. rate 19, height 6' (1.829 m), weight 79.1 kg, SpO2 97 %.  The patient is calm and cooperative at this time.  There have been no acute events since the last update.  Awaiting disposition plan from Behavioral Medicine and/or Social Work team(s).    Harvest Dark, MD 01/12/20 [phone]

## 2020-01-12 NOTE — ED Notes (Signed)
Pt up to bathroom.

## 2020-01-12 NOTE — ED Notes (Signed)
Hourly rounding reveals patient sleeping in room. No complaints, stable, in no acute distress. Q15 minute rounds and monitoring via Security Cameras to continue. 

## 2020-01-12 NOTE — ED Notes (Signed)
Patient resting quietly in room. No noted distress or abnormal behaviors noted. Will continue 15 minute checks and observation by security camera for safety. 

## 2020-01-12 NOTE — ED Notes (Signed)
This NT gave pt breakfast tray at 9:00am.

## 2020-01-12 NOTE — ED Notes (Signed)
Report to include Situation, Background, Assessment, and Recommendations received from RN. Patient alert and oriented, warm and dry, in no acute distress. Patient denies SI, HI, AVH and pain. Patient made aware of Q15 minute rounds and security cameras for their safety. Patient instructed to come to me with needs or concerns.  

## 2020-01-13 DIAGNOSIS — K7200 Acute and subacute hepatic failure without coma: Secondary | ICD-10-CM | POA: Diagnosis not present

## 2020-01-13 NOTE — ED Notes (Signed)
Pt requested apple juice. Provided with a cup of water and informed that per policy, no flavored drinks are to be given during lights out. Pt returned to bed

## 2020-01-13 NOTE — ED Notes (Signed)
Hourly rounding reveals patient sleeping in room. No complaints, stable, in no acute distress. Q15 minute rounds and monitoring via Security Cameras to continue. 

## 2020-01-13 NOTE — ED Notes (Signed)
Pt given breakfast tray and a sprite.

## 2020-01-13 NOTE — ED Notes (Signed)
Hourly rounding reveals patient asleep in room. No complaints, stable, in no acute distress. Q15 minute rounds and monitoring via Security Cameras to continue. 

## 2020-01-13 NOTE — ED Notes (Signed)
Patient ate some breakfast and beverage, ask for crackers also, Nurse obtained for him, nurse ask him how He was feeling, He denies Si/hi or ahv, states that He was ok, just tired, nurse will continue to monitor, q 15 minute checks and camera surveillance in progress for safety.

## 2020-01-13 NOTE — ED Notes (Signed)
IVC renewed and on chart

## 2020-01-13 NOTE — ED Notes (Signed)
Patient eating lunch, no signs of distress, will continue to monitor.

## 2020-01-13 NOTE — ED Notes (Signed)
Pt ambulated to day room and waved to staff before returning to bed

## 2020-01-13 NOTE — ED Notes (Signed)
Report to include Situation, Background, Assessment, and Recommendations received from Wendy RN. Patient alert, warm and dry, in no acute distress. Patient denies SI, HI, AVH and pain. Patient made aware of Q15 minute rounds and security cameras for their safety. Patient instructed to come to me with needs or concerns. 

## 2020-01-13 NOTE — ED Notes (Signed)
Pt ambulated to day room to check time before returning to bed

## 2020-01-13 NOTE — Care Management (Signed)
TOC CM: Incoming call from [place] @ SNF needing confirmation that brother was healthcare POA as well as financial POA. Confirmed brother is Economist and financial POA.   CM had lengthy and detailed conversation with brother regarding placement options, finances and potential discharge plan. Brother is concerned about distance however understands no availability. Confirmed patient makes [phone] in social security and has enough in savings to cover 5-6 months of ALF to allow time for brother to sale land to pay for continued stay.

## 2020-01-13 NOTE — Care Management (Addendum)
TOC CM: Attempted to outreach to [facility] @ [phone] in order to follow up on potential discharge plan previously discussed. No answer and no option to leave message.   TOC CM: LVMM for [facility] BYRD [id number] for [facility] locked unit bed availability.   TOC CM: Spoke with Trey Sailors [phone], liason for [facility] regarding potential placement in memory care unit. Audelia Acton states he will call back with options.   TOC CM: Incoming call from Maureen Chatters @ [facility] requesting last 1-2 weeks of notes displaying behavior be faxed to [phone]. CM will complete.

## 2020-01-13 NOTE — ED Notes (Signed)
Pt ambulatory to bathroom with steady gait.

## 2020-01-14 DIAGNOSIS — K7200 Acute and subacute hepatic failure without coma: Secondary | ICD-10-CM | POA: Diagnosis not present

## 2020-01-14 MED ORDER — LORAZEPAM 0.5 MG PO TABS
0.5000 mg | ORAL_TABLET | Freq: Every day | ORAL | Status: DC | PRN
  Administered 2020-01-15: 0.5 mg via ORAL
  Filled 2020-01-14: qty 1
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
## 2020-01-14 NOTE — ED Notes (Signed)
Hourly rounding reveals patient sleeping in room. No complaints, stable, in no acute distress. Q15 minute rounds and monitoring via Security Cameras to continue. 

## 2020-01-14 NOTE — ED Notes (Signed)
Report to include Situation, Background, Assessment, and Recommendations received from Amy B. RN. Patient alert and, warm and dry, in no acute distress. Patient denies SI, HI, AVH and pain. Patient made aware of Q15 minute rounds and security cameras for their safety. Patient instructed to come to me with needs or concerns. 

## 2020-01-14 NOTE — ED Provider Notes (Signed)
-----------------------------------------   3:06 AM on 01/14/2020 -----------------------------------------   Blood pressure (!) 132/59, pulse 100, temperature 98.3 F (36.8 C), temperature source Oral, resp. rate 18, height 1.829 m (6'), weight 79.1 kg, SpO2 99 %.  The patient is calm and cooperative at this time.  There have been no acute events since the last update.  Awaiting disposition plan from Behavioral Medicine and/or Social Work team(s).   Hinda Kehr, MD 01/14/20 [phone]
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
## 2020-01-14 NOTE — ED Notes (Signed)
Pt given a sandwich tray, icecream and a drink

## 2020-01-14 NOTE — ED Notes (Signed)
IVC/Pending Placement 

## 2020-01-14 NOTE — ED Notes (Signed)
Hourly rounding reveals patient awake in room. No complaints, stable, in no acute distress. Q15 minute rounds and monitoring via Security Cameras to continue. 

## 2020-01-14 NOTE — Care Management (Addendum)
TOC RN CM: Marella Chimes, APS SW of possible placement to [facility] and update regarding patient anticipated to make $9 more than allowed for Medicaid.  Call to Trey Sailors with [facility] regarding bed availability at [facility] skilled location and [facility].   Bed offer made from [facility]. RN CM spoke with patient who is agreeable to ALF transfer. Will arrange transportation for tomorrow morning New Fl2 created for ALF at facility request. ALF will outreach to brother for financial confirmation. Brother is aware of transfer to [place] [facility].

## 2020-01-14 NOTE — ED Notes (Signed)
Hourly rounding reveals patient in room. No complaints, stable, in no acute distress. Q15 minute rounds and monitoring via Security Cameras to continue. 

## 2020-01-14 NOTE — Care Management (Signed)
TOC RN CM: Spoke with brother, [name], who states he also was interested in [facility] as a back up for ALF if patient is not accepted to [facility].

## 2020-01-15 DIAGNOSIS — K7200 Acute and subacute hepatic failure without coma: Secondary | ICD-10-CM | POA: Diagnosis not present

## 2020-01-15 NOTE — Discharge Instructions (Addendum)
Please follow up with your doctor this week for continued monitoring of your symptoms.

## 2020-01-15 NOTE — ED Notes (Signed)
Hourly rounding reveals patient sleeping in room. No complaints, stable, in no acute distress. Q15 minute rounds and monitoring via Security Cameras to continue. 

## 2020-01-15 NOTE — ED Provider Notes (Signed)
-----------------------------------------   5:38 AM on 01/15/2020 -----------------------------------------   Blood pressure 125/62, pulse 100, temperature 97.9 F (36.6 C), temperature source Oral, resp. rate 16, height 6' (1.829 m), weight 79.1 kg, SpO2 95 %.  The patient is calm and cooperative at this time.  There have been no acute events since the last update.  Awaiting disposition plan from Behavioral Medicine and/or Social Work team(s).   Paulette Blanch, MD 01/15/20 [phone]

## 2020-01-15 NOTE — ED Provider Notes (Signed)
Procedures     ----------------------------------------- 12:30 PM on 01/15/2020 -----------------------------------------   Pt has been cleared for discharge home, and has transport coming to take him home, as arranged by SW.   Carrie Mew, MD 01/15/20 1230

## 2020-01-15 NOTE — ED Notes (Signed)
[facility] called @ [phone] spoke with [place], report given awaiting patient arrival. Transport to be here by 1300.

## 2020-01-15 NOTE — ED Notes (Signed)
Hourly rounding reveals patient in room. No complaints, stable, in no acute distress. Q15 minute rounds and monitoring via Security Cameras to continue. 

## 2020-01-15 NOTE — ED Notes (Signed)
Patient showered and changed clothing this morning. Scheduled meds given.

## 2020-01-15 NOTE — TOC Progression Note (Addendum)
Transition of Care [facility]) - Progression Note    Patient Details  Name: Bryan Buck MRN: [id number] Date of Birth: 04/03/1934  Transition of [facility]) CM/SW [place], RN Phone Number: 01/15/2020, 12:06 PM  Clinical Narrative:    Confirmed with Tillie Rung @ [place] patient cleared for admission today. Arranged transportation with Psych[facility], prison and probation services for 1pm today. LVMM with [name], brother,  to update of transfer to ALF today. Report to be called to special care unit @ [phone]. Nurse aware.   Expected Discharge Plan: [place]    Expected Discharge Plan and Services Expected Discharge Plan: [place] arrangements for the past 2 months: Single Family Home                                       Social Determinants of Health (SDOH) Interventions    Readmission Risk Interventions No flowsheet data found.

## 2020-01-15 NOTE — ED Notes (Signed)
Patient up early this morning at nursing station requesting something to eat.Patient advised breakfast will be here soon, snack offered in the meantime, patient accepted and went back to his room. Patient pleasant and soft spoken, denies HI/HV/SI.

## 2020-03-26 DEATH — deceased

## 2021-06-06 IMAGING — MR MR HEAD WO/W CM
13 series · 48 of 48 positions shown · IV contrast (gadavist)
Comparison: Head CT 6842 hours today.  Brain MRI 10/18/2014.

CLINICAL DATA: 85-year-old male with altered mental status and
abnormal noncontrast head CT earlier today demonstrating left
hemisphere edema and mass effect.

EXAM:
MRI HEAD WITHOUT AND WITH CONTRAST
TECHNIQUE: Multiplanar, multiecho pulse sequences of the brain and surrounding
structures were obtained without and with intravenous contrast.
CONTRAST:  8mL GADAVIST GADOBUTROL 1 MMOL/ML IV SOLN

[Series 5: ax dwi_tracew · axial · 3.0mm · 1.31mm/px · z∈[-68,+93]mm · 4 of 50 slices shown]
[im 1/50]
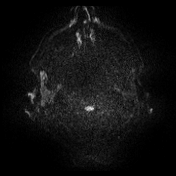
[im 17/50]
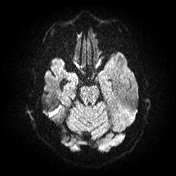
[im 33/50]
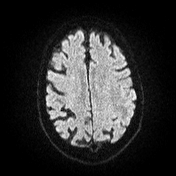
[im 50/50]
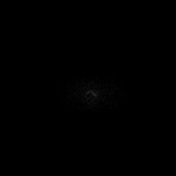

[Series 6: ax dwi_adc · axial · 3.0mm · 1.31mm/px · z∈[-68,+93]mm · 5 of 50 slices shown]
[im 1/50]
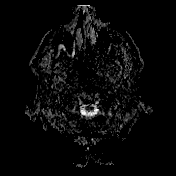
[im 13/50]
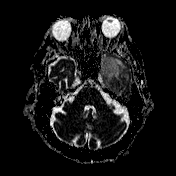
[im 25/50]
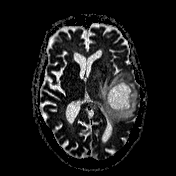
[im 37/50]
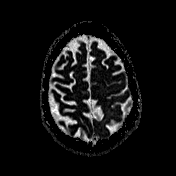
[im 50/50]
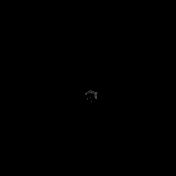

[Series 7: cor dwi_tracew · coronal · 5.0mm · 1.31mm/px · 5 of 42 slices shown]
[im 1/42]
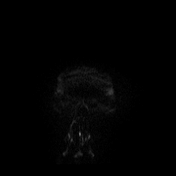
[im 11/42]
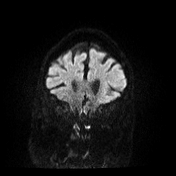
[im 21/42]
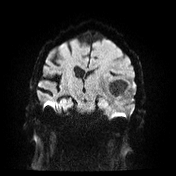
[im 31/42]
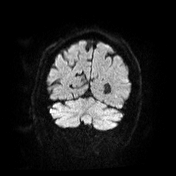
[im 42/42]
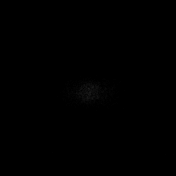

[Series 8: cor dwi_adc · coronal · 5.0mm · 1.31mm/px · 5 of 42 slices shown]
[im 1/42]
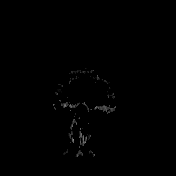
[im 11/42]
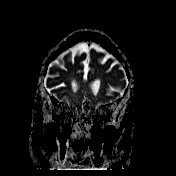
[im 21/42]
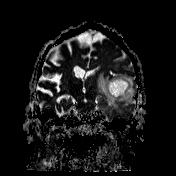
[im 31/42]
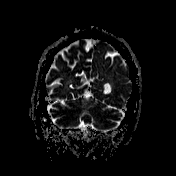
[im 42/42]
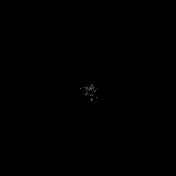

[Series 9: FLAIR · axial · 5.0mm · 1.20mm/px · z∈[-65,+90]mm · 3 of 27 slices shown]
[im 1/27]
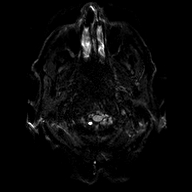
[im 14/27]
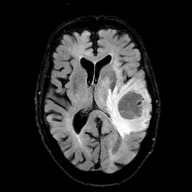
[im 27/27]
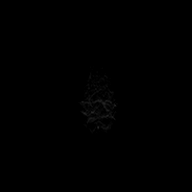

[Series 10: T2 · axial · 5.0mm · 0.45mm/px · z∈[-66,+89]mm · 3 of 27 slices shown (1 of 2)]
[im 1/27]
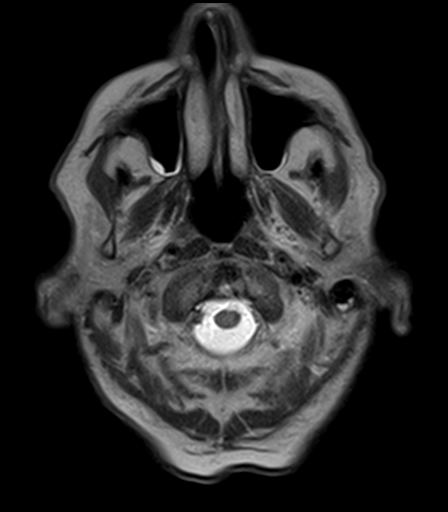
[im 14/27]
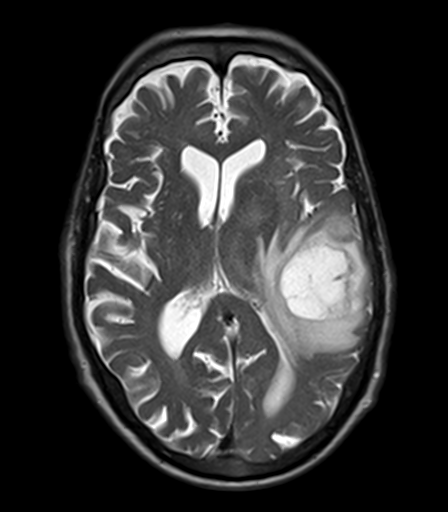
[im 27/27]
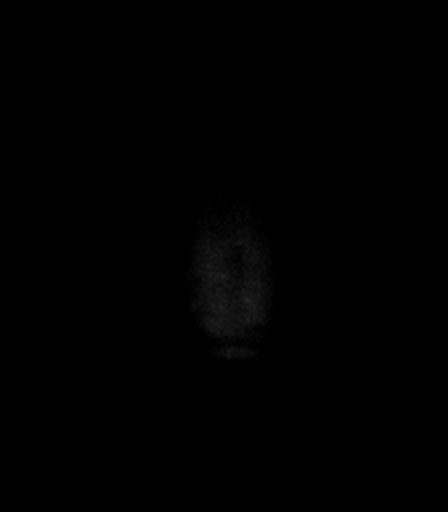

[Series 11: T2-star · axial · 5.0mm · 0.45mm/px · z∈[-66,+89]mm · 3 of 27 slices shown]
[im 1/27]
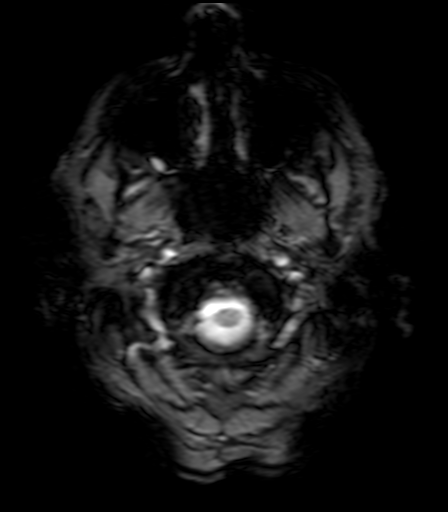
[im 14/27]
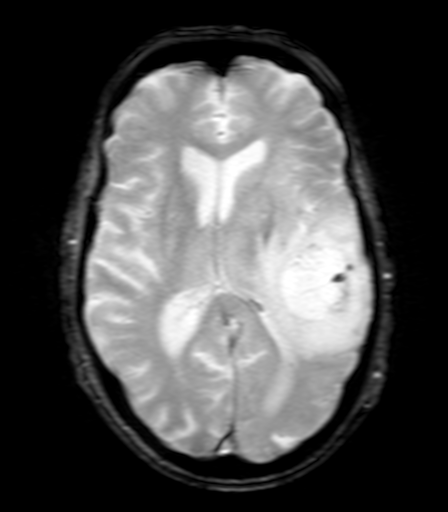
[im 27/27]
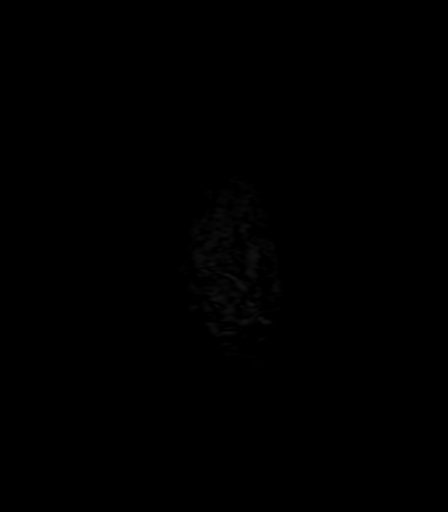

[Series 12: T2 · coronal · 5.0mm · 0.45mm/px · 4 of 36 slices shown (2 of 2)]
[im 1/36]
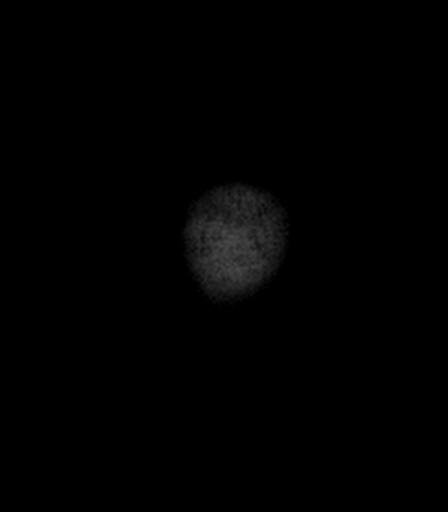
[im 12/36]
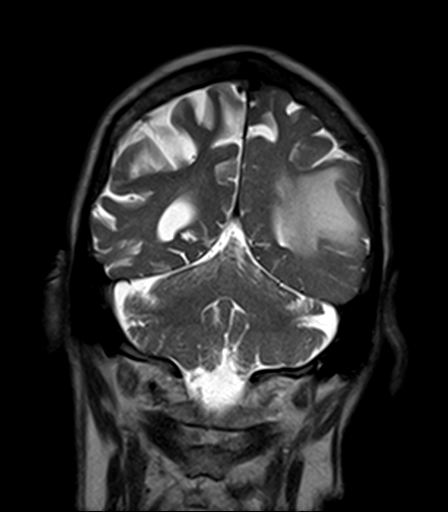
[im 24/36]
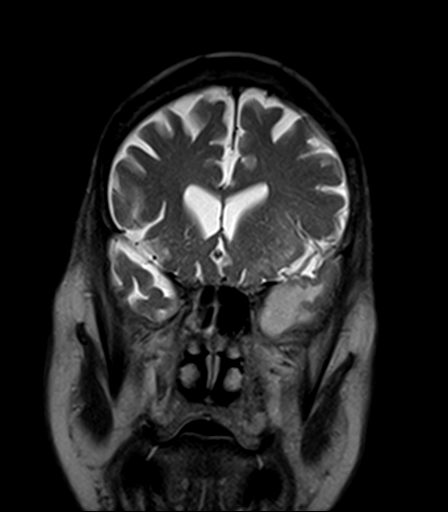
[im 36/36]
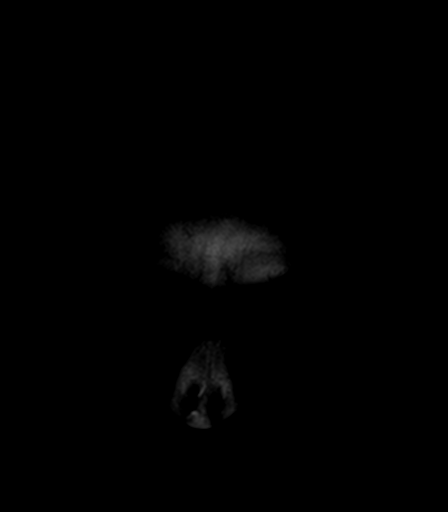

[Series 13: T1 · sagittal · 5.0mm · 0.94mm/px · 3 of 23 slices shown (1 of 2)]
[im 1/23]
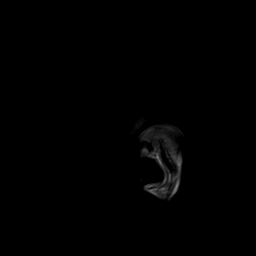
[im 12/23]
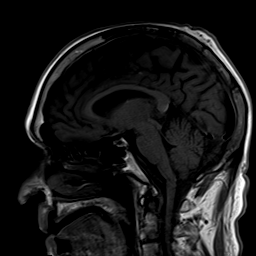
[im 23/23]
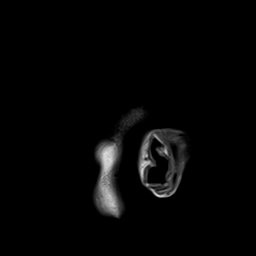

[Series 14: T1 · axial · 5.0mm · 0.90mm/px · z∈[-66,+89]mm · 3 of 27 slices shown (2 of 2)]
[im 1/27]
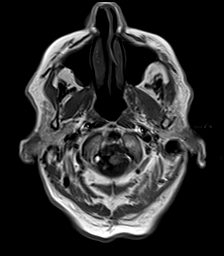
[im 14/27]
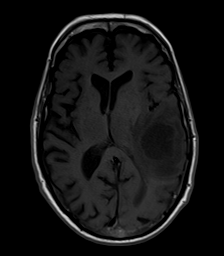
[im 27/27]
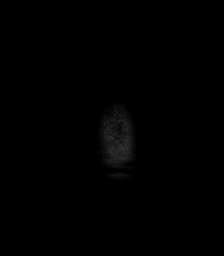

[Series 15: T1 post-contrast · axial · 5.0mm · 0.90mm/px · z∈[-66,+89]mm · 3 of 27 slices shown (1 of 3)]
[im 1/27]
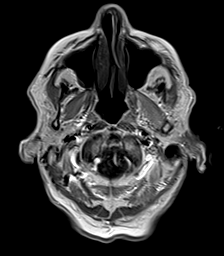
[im 14/27]
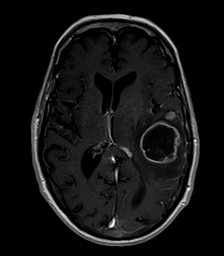
[im 27/27]
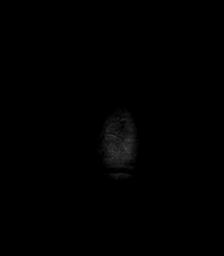

[Series 16: T1 post-contrast · coronal · 5.0mm · 0.90mm/px · 4 of 36 slices shown (2 of 3)]
[im 1/36]
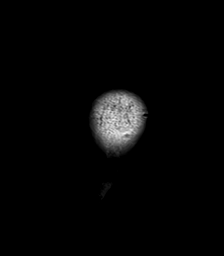
[im 12/36]
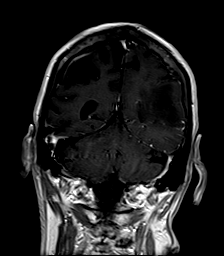
[im 24/36]
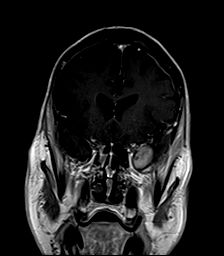
[im 36/36]
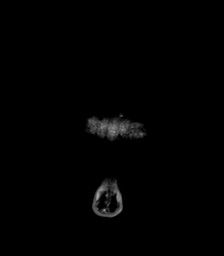

[Series 17: T1 post-contrast · sagittal · 5.0mm · 0.94mm/px · 3 of 23 slices shown (3 of 3)]
[im 1/23]
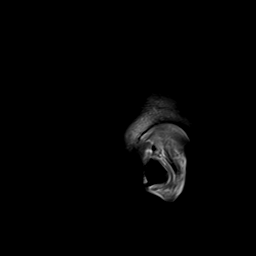
[im 12/23]
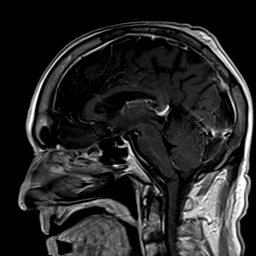
[im 23/23]
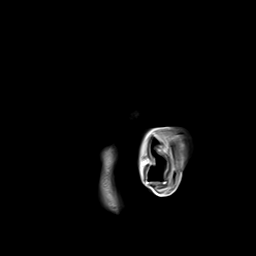

[48 of 48 positions shown; findings below may reference images not displayed]

FINDINGS: Brain: There are multiple enhancing masses affecting the left
hemisphere.

The largest lesion is a rim enhancing cystic, partially hemorrhagic
mass centered in the posterior left temporal lobe measuring 41
millimeters diameter (series 15, image 13 and series 16, image 16).
Anterior to the dominant mass is an adjacent solidly enhancing round
18 millimeter lesion (series 15, image 12) with mildly decreased T2
signal (series 10, image 12) and mildly restricted diffusion. In the
nearby left deep gray matter nuclei cauda thalamic groove there is a
similar round more solidly enhancing 14 millimeter mass (series 15,
image 12).

Furthermore there is another somewhat T2 hypointense oval enhancing
mass occupying the left hippocampus measuring 18 millimeters (series
16, image 16 and series 12, image 16). That lesion is also somewhat
restricted on diffusion.

And finally, in the left middle cranial fossa there is an
intra-axial versus extra-axial round 30 millimeter diameter solidly
enhancing mass on series 15, image 7. That lesion seems to have
adjacent dural thickening (same image). But no other dural
thickening is identified throughout the brain. And on sagittal T1
images (series 13, image 16) the lesion appears more intra-axial.

All of these are new since the 3176 MRI. There is subsequent
abundant T2 and FLAIR hyperintensity resembling vasogenic edema,
present throughout the left temporal lobe, tracking into the
posterior deep white matter capsules and toward the superior left
perirolandic cortex. Associated mass effect on the left lateral
ventricle and 5 millimeters of rightward midline shift. Basilar
cisterns remain patent.

Conspicuous absence of lesions in the right hemisphere. No brainstem
or posterior fossa involvement. Negative visible cervical spinal
cord.

No superimposed restricted diffusion suggestive of acute infarction.
No ventriculomegaly, ependymal enhancement, or acute intracranial
hemorrhage. Cervicomedullary junction and pituitary are within
normal limits.

Progressed mild white matter signal changes in the right hemisphere
suggesting chronic small vessel disease. No cerebral blood products
identified outside of the dominant enhancing mass.

Vascular: Major intracranial vascular flow voids are stable since
3176. Dominant and tortuous right vertebral artery. The major dural
venous sinuses seem to be patent.

Skull and upper cervical spine: Visualized bone marrow signal is
within normal limits.

Sinuses/Orbits: Stable and negative orbits. Paranasal sinus mucosal
thickening has regressed since 3176.

Other: Mild chronic left mastoid effusion has not significantly
changed. Visible internal auditory structures appear normal. Scalp
and face soft tissues appear negative.
IMPRESSION: 1. There is a heterogeneous population of Five enhancing brain
masses limited to the left hemisphere.
All are new from a 3176 brain MRI. They range from solid and 14 mm
diameter to cystic/partially hemorrhagic measuring 41 mm diameter.
Diffusion and T2 signal varies between the masses. And one of the
lesions (left middle cranial fossa, 30 mm) might be extra-axial and
seems to have a small area of associated dural thickening.
2. Infiltrating, multifocal high-grade Glioma or Glioblastoma is a
possibility.
CNS Lymphoma (particularly large B-cell type) is also a
consideration.
Metastatic disease is possible but multiple lesions this size
limited to the left hemisphere would be highly unusual.
Superimposed meningioma was considered for the left middle cranial
fossa lesion, but not favored.
Infectious or inflammatory etiology is felt unlikely, and the
dominant cystic lesion does NOT have MRI characteristics of abscess.
3. Associated edema versus nonenhancing tumor in the left hemisphere
with mass effect on the left lateral ventricle and rightward midline
shift of 5 mm. Basilar cisterns remain patent and there is no
ventriculomegaly.
